# Patient Record
Sex: Male | Born: 1970 | Race: Black or African American | Hispanic: No | Marital: Married | Smoking: Never smoker
Health system: Southern US, Community
[De-identification: ages and names within clinical notes are randomized; demographics above are authoritative.]

## PROBLEM LIST (undated history)

## (undated) ENCOUNTER — Emergency Department (HOSPITAL_COMMUNITY): Admission: EM | Payer: No Typology Code available for payment source | Source: Home / Self Care

## (undated) DIAGNOSIS — N200 Calculus of kidney: Secondary | ICD-10-CM

## (undated) DIAGNOSIS — N281 Cyst of kidney, acquired: Secondary | ICD-10-CM

## (undated) DIAGNOSIS — M519 Unspecified thoracic, thoracolumbar and lumbosacral intervertebral disc disorder: Secondary | ICD-10-CM

## (undated) DIAGNOSIS — K760 Fatty (change of) liver, not elsewhere classified: Secondary | ICD-10-CM

## (undated) HISTORY — DX: Fatty (change of) liver, not elsewhere classified: K76.0

## (undated) HISTORY — DX: Cyst of kidney, acquired: N28.1

## (undated) HISTORY — PX: APPENDECTOMY: SHX54

---

## 2008-01-22 ENCOUNTER — Inpatient Hospital Stay (HOSPITAL_COMMUNITY): Admission: EM | Admit: 2008-01-22 | Discharge: 2008-01-30 | Payer: Self-pay | Admitting: Emergency Medicine

## 2010-06-06 ENCOUNTER — Emergency Department (HOSPITAL_COMMUNITY)
Admission: EM | Admit: 2010-06-06 | Discharge: 2010-06-06 | Disposition: A | Discharge status: Home / Self Care | Payer: No Typology Code available for payment source | Attending: Emergency Medicine | Admitting: Emergency Medicine

## 2010-06-06 DIAGNOSIS — M79609 Pain in unspecified limb: Secondary | ICD-10-CM | POA: Insufficient documentation

## 2010-06-06 DIAGNOSIS — Y929 Unspecified place or not applicable: Secondary | ICD-10-CM | POA: Insufficient documentation

## 2010-06-06 DIAGNOSIS — S8010XA Contusion of unspecified lower leg, initial encounter: Secondary | ICD-10-CM | POA: Insufficient documentation

## 2010-08-13 NOTE — Op Note (Signed)
NAMEKATHLEEN, LIKINS                 ACCOUNT NO.:  [id number]   MEDICAL RECORD NO.:  [id number]          PATIENT TYPE:  INP   LOCATION:  5126                         FACILITY:  [facility]   PHYSICIAN:  Maisie Fus A. Cornett, M.D.DATE OF BIRTH:  [birth year]   DATE OF PROCEDURE:  01/22/2008  DATE OF DISCHARGE:                               OPERATIVE REPORT   PREOPERATIVE DIAGNOSIS:  History of open appendectomy 4 days ago with  right lower quadrant abdominal wound dehiscence.   POSTOPERATIVE DIAGNOSIS:  History of open appendectomy 4 days ago with  right lower quadrant abdominal wound dehiscence.   PROCEDURE:  Closure of right lower quadrant abdominal wound.   SURGEON:  Maisie Fus A. Cornett, MD   ANESTHESIA:  General endotracheal anesthesia.   ESTIMATED BLOOD LOSS:  30 mL.   DRAINS:  None.   INDICATIONS FOR PROCEDURE:  The patient is a pleasant 40 year old male  who is in [place] about 4 ago on business.  He developed appendicitis  and underwent an open appendectomy in [place].  He was brought was home  by his wife, but overnight apparently had some coughing and felt a pop  in his abdominal wall and then begin to have nausea and vomiting.  Came  to the emergency room.  CT scan showed dehiscence of the abdominal  musculature in his right lower quadrant appendectomy incision.  There  was small bowel up in this.  He was brought emergently to the operating  room for closure of the abdominal wall dehiscence.   DESCRIPTION OF PROCEDURE:  The patient was brought to the operating room  and placed supine.  Induction of general anesthesia, the abdomen was  prepped and draped in sterile fashion.  Right lower quadrant incision  was then opened.  There was no signs of infection.  Upon opening the  wound, I was able to get down to the subcutaneous fat till I encountered  the aponeurosis of the external oblique.  There were silk sutures  closing this in an interrupted fashion and I went ahead and opened  this  up.  Once I did this, I was able to visualize the internal oblique and  transversus muscle layers.  The medial portion of this part of the  abdominal wall had opened.  There were small bowel stuck in this.  I was  easily able to get my finger pushed this back in and then I opened this  layer up by removing the deep layers interrupted sutures and it.  Once I  did this, I was able then to put my finger in the wound sweep around.  The bowel was healthy and viable and was replaced back in the abdominal  cavity.  There were some mild edema.  I then was able to locate the  peritoneum and grasped this with Kochers.  I then close this layer.  After irrigating the wound out with a running 2-0 PDS suture, the tissue  was healthy with no signs of necrosis nor any signs of infection.  I was  able to close this deep layer with two of these to  encompass the  peritoneal lining as well as the internal as well as the transversus  muscle.  Another layer of 2-0 PDS was used to close the external oblique  overall this and two of these were used in a running fashion.  I then  closed the external oblique with a 2-0 PDS  running suture.  I irrigated the subcu tissues.  I elected to close the  skin with staples put packing in between each these skin staples.  Dry  dressing was applied.  All final counts of sponge, needle and  instruments were found to be correct this portion of the case.  The  patient was then awoke, taken to recovery in satisfactory condition.      Thomas A. Cornett, M.D.  Electronically Signed     TAC/MEDQ  D:  01/22/2008  T:  01/22/2008  Job:  [id number]

## 2010-08-13 NOTE — H&P (Signed)
Jonathan Mcmahon, Jonathan Mcmahon                 ACCOUNT NO.:  [id number]   MEDICAL RECORD NO.:  [id number]          PATIENT TYPE:  INP   LOCATION:                               FACILITY:  [facility]   PHYSICIAN:  Wilmon Arms. Corliss Skains, M.D. DATE OF BIRTH:  04/24/1970   DATE OF ADMISSION:  01/22/2008  DATE OF DISCHARGE:                              HISTORY & PHYSICAL   CHIEF COMPLAINT:  Abdominal distention, nausea, and vomiting.   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  He is status post an open appendectomy  on January 19, 2008 in [place], [place].  He apparently was discharged  from that hospital on January 20, 2008.  The patient reports that he had  a lot of coughing postoperatively.  His wife drove him back from [place]  and ever since discharge, he has had a lot of abdominal distention,  nausea, and vomiting.  He has not had a bowel movement since discharge.  He presents to the emergency department for evaluation.   PAST MEDICAL HISTORY:  Kidney stones.   PAST SURGICAL HISTORY:  Open appendectomy.   ALLERGIES:  None.   MEDICATIONS:  None.   SOCIAL HISTORY:  Nonsmoker, nondrinker.   PHYSICAL EXAMINATION:  VITAL SIGNS:  Temperature 99.0, blood pressure  143/94, pulse 106, respirations 20, sat is 94%.  GENERAL:  This is a well-developed, well-nourished male who appears  uncomfortable.  HEENT:  EOMI.  Sclerae anicteric.  NECK:  No mass.  No thyromegaly.  LUNGS:  Clear.  Normal respiratory effort.  HEART:  Regular rate and rhythm.  No murmur.  ABDOMEN:  Positive bowel sounds, distended.  His incision is intact.  He  is tender around this incision.   LABORATORY STUDIES:  CT scan of the abdomen and pelvis shows dilated  proximal small bowel with obstruction in the right lower quadrant  abdominal wall defect.  No sign of free air.  No fluid collection or  abscess.  White count 11.7, hemoglobin 15.1, lactate 1.6, lipase 13.  Electrolytes within normal limits.   IMPRESSION:  Fascial dehiscence in the right lower quadrant from recent  open appendectomy with resulted small-bowel obstruction.   PLAN:  He will go to the operating room today for exploratory laparotomy  and repair of abdominal wall defect.  I have discussed this case with  Dr. Luisa Hart, who is on call today.  He will resume care of this patient.      Wilmon Arms. Tsuei, M.D.  Electronically Signed     MKT/MEDQ  D:  01/22/2008  T:  01/22/2008  Job:  [id number]

## 2010-08-16 NOTE — Discharge Summary (Signed)
NAMECEPHAS, REVARD                 ACCOUNT NO.:  [id number]   MEDICAL RECORD NO.:  [id number]          PATIENT TYPE:  INP   LOCATION:  5126                         FACILITY:  [facility]   PHYSICIAN:  Gabrielle Dare. Janee Morn, M.D.DATE OF BIRTH:  11-11-1970   DATE OF ADMISSION:  01/21/2008  DATE OF DISCHARGE:  01/30/2008                               DISCHARGE SUMMARY   ADMITTING PHYSICIAN:  Wilmon Arms. Corliss Skains, MD   DISCHARGING PHYSICIAN:  Gabrielle Dare. Janee Morn, MD   PROCEDURES:  Closure of right lower quadrant abdominal wound dehiscence  by Dr. Luisa Hart on January 22, 2008.  There were no consultants.   REASON FOR ADMISSION:  Mr. 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  He was status post an open  appendectomy on December 30, 2007 in [place], [place].  He apparently was  discharged from the hospital on January 20, 2008.  The patient reports  that he had a lot of coughing postoperatively.  He states his wife drove  him back from [place] and ever since the discharge, he has had lot of  abdominal distention, nausea, and vomiting.  He also states that he has  not had a bowel movement since discharge.  This time, he was presented  to the emergency department for evaluation.  CT scan of the abdomen and  pelvis shows dilated proximal small bowel with obstruction of the right  lower quadrant and an abdominal wall defect.  Please see admitting  history and physical for further details.   ADMITTING DIAGNOSIS:  Fascial dehiscence in the right lower quadrant  from recent open appendectomy with results of small bowel obstruction.   HOSPITAL COURSE:  At this time, the patient was admitted and taken to  the operating room for an exploratory laparotomy with repair of his  abdominal wall defect.  He was also started on cefoxitin prior to the OR  as well.  Was in the OR.  A closure of his right lower quadrant  abdominal wound dehiscence was performed by Dr. Luisa Hart.  The patient  tolerated this procedure well without any complications.  On  postoperative day #1, the patient had an NG tube placed as the patient  became distended overnight with nausea.  On exam, his abdomen was  distended and tight with some bowel sounds.  However, at this time due  to his distention and nausea, his NG tube was continued in.  On  postoperative day #2, the patient was feeling better with pain better  controlled.  He states he is passing flatus.  However, on exam, he is to  feel tight and distended, currently without any bowel sounds.  At this  time, the patient wants to try with his NG tube clamped and therefore,  his NG tube was clamped; however, he was kept n.p.o. except for few ice  chips.  However, by postoperative day #3, apparently overnight, the  patient got nauseated and his NG tube was put back to suction.  However,  on postoperative day #3, the patient was continuing to have no flatus,  but  felt pressure as if he was getting flatus.  At this time, his  abdomen was soft and still tender with some distention.  At this time,  his NG tube was once again clamped and he was given a Dulcolax  suppository.  By postoperative day #4, the patient was not complaining  of any nausea or vomiting with his NG tube clamped.  He states that he  had had one bowel movement the day prior with his suppository, however,  he denied any further flatus at this time.  At this time due to the  patient not having any nausea and vomiting, his NG tube was  discontinued.  However, he was kept n.p.o. except for ice chips.  By  postoperative day #5, it was felt that the patient's postop ileus had  began to resolve as the patient was currently passing flatus and had few  bowel sounds.  Therefore at this time, his diet was advanced to clear  liquids.  At this time, his diet was continued over the next several  days and by postoperative day #8, the patient's diet had been completely  advanced and was tolerating  this well.  Also with the advancement of his  diet, his IV pain medicines were discontinued and he was started on p.o.  Vicodin, which the patient was tolerating well.  By postoperative day,  the patient's wound was clean, dry, and intact.  His abdomen was soft  with active bowel sounds.  At this time, it was felt that the patient  was stable for discharge home.   DISCHARGE DIAGNOSES:  1. Status post open appendectomy in [place].  2. Right lower quadrant abdominal wound dehiscence.  3. Status post exploratory laparotomy with repair of abdominal wall      dehiscence.  4. Postoperative ileus which had resolved.   DISCHARGE MEDICATIONS:  He had no home medications and he was sent home  with Vicodin 5/500 one to two q.6 h. as needed for pain.   DISCHARGE INSTRUCTIONS:  The patient had no dietary restrictions.  He  was informed that he may increase his activity slowly and he may walk  with steps.  He was informed that he may shower, however, he was not  bathe for the next 2 weeks.  Otherwise, he is not to lift anything for  approximately the next 5 weeks.  He was also given the instructions that  he was not allowed to drive until his followup appointment, which was  set to see Dr. Corliss Skains in 1 week.  Of note, Dr. Luisa Hart did his surgery,  so, I am not sure whether he actually followed up with Dr. Corliss Skains or Dr.  Luisa Hart.  Either away, he is to see one of whom in 1 week after his  discharge.      Letha Cape, PA      Gabrielle Dare. Janee Morn, M.D.  Electronically Signed    KEO/MEDQ  D:  03/02/2008  T:  03/02/2008  Job:  [id number]   cc:   Thomas A. Cornett, M.D.  Wilmon Arms. Tsuei, M.D.

## 2010-12-31 LAB — CBC
HCT: 37.5 — ABNORMAL LOW
HCT: 38.8 — ABNORMAL LOW
HCT: 39.1
HCT: 44.7
Hemoglobin: 12.5 — ABNORMAL LOW
Hemoglobin: 13
MCHC: 32.5
MCHC: 33.2
MCHC: 33.5
MCHC: 33.8
MCV: 90.2
MCV: 93.3
Platelets: 234
Platelets: 264
RBC: 4.05 — ABNORMAL LOW
RBC: 4.19 — ABNORMAL LOW
RBC: 4.95
RDW: 13.1
RDW: 13.3
RDW: 13.5
WBC: 11.1 — ABNORMAL HIGH
WBC: 11.2 — ABNORMAL HIGH
WBC: 11.4 — ABNORMAL HIGH
WBC: 11.7 — ABNORMAL HIGH

## 2010-12-31 LAB — BASIC METABOLIC PANEL
BUN: 10
BUN: 8
BUN: 8
Calcium: 8.7
Chloride: 109
Creatinine, Ser: 0.61
Creatinine, Ser: 0.64
Creatinine, Ser: 0.64
GFR calc Af Amer: >60
GFR calc Af Amer: >60
GFR calc Af Amer: >60
GFR calc Af Amer: >60
GFR calc non Af Amer: >60
Glucose, Bld: 100 — ABNORMAL HIGH
Glucose, Bld: 104 — ABNORMAL HIGH
Glucose, Bld: 111 — ABNORMAL HIGH
Glucose, Bld: 96
Potassium: 3.4 — ABNORMAL LOW
Potassium: 3.7
Sodium: 139
Sodium: 141

## 2010-12-31 LAB — COMPREHENSIVE METABOLIC PANEL
ALT: 36
Albumin: 3.2 — ABNORMAL LOW
Alkaline Phosphatase: 51
BUN: 15
BUN: 9
Calcium: 8.5
Chloride: 98
Creatinine, Ser: 0.87
GFR calc Af Amer: >60
GFR calc Af Amer: >60
Glucose, Bld: 136 — ABNORMAL HIGH
Potassium: 3.9
Sodium: 139
Total Bilirubin: 0.9

## 2010-12-31 LAB — URINE CULTURE: Culture: NO GROWTH

## 2010-12-31 LAB — DIFFERENTIAL
Basophils Relative: 0
Eosinophils Relative: 0
Lymphs Abs: 1
Monocytes Absolute: 1.3 — ABNORMAL HIGH
Neutro Abs: 9.5 — ABNORMAL HIGH
Neutrophils Relative %: 81 — ABNORMAL HIGH

## 2010-12-31 LAB — URINE MICROSCOPIC-ADD ON

## 2010-12-31 LAB — URINALYSIS, ROUTINE W REFLEX MICROSCOPIC: Ketones, ur: >80 — AB

## 2011-03-15 ENCOUNTER — Encounter: Payer: Self-pay | Admitting: Emergency Medicine

## 2011-03-15 ENCOUNTER — Emergency Department (HOSPITAL_COMMUNITY): Payer: Self-pay

## 2011-03-15 ENCOUNTER — Emergency Department (HOSPITAL_COMMUNITY)
Admission: EM | Admit: 2011-03-15 | Discharge: 2011-03-15 | Disposition: A | Discharge status: Home / Self Care | Payer: Self-pay | Attending: Emergency Medicine | Admitting: Emergency Medicine

## 2011-03-15 DIAGNOSIS — R059 Cough, unspecified: Secondary | ICD-10-CM | POA: Insufficient documentation

## 2011-03-15 DIAGNOSIS — J069 Acute upper respiratory infection, unspecified: Secondary | ICD-10-CM | POA: Insufficient documentation

## 2011-03-15 DIAGNOSIS — J3489 Other specified disorders of nose and nasal sinuses: Secondary | ICD-10-CM | POA: Insufficient documentation

## 2011-03-15 DIAGNOSIS — M546 Pain in thoracic spine: Secondary | ICD-10-CM | POA: Insufficient documentation

## 2011-03-15 DIAGNOSIS — R6883 Chills (without fever): Secondary | ICD-10-CM | POA: Insufficient documentation

## 2011-03-15 DIAGNOSIS — R05 Cough: Secondary | ICD-10-CM | POA: Insufficient documentation

## 2011-03-15 DIAGNOSIS — R0789 Other chest pain: Secondary | ICD-10-CM | POA: Insufficient documentation

## 2011-03-15 MED ORDER — IBUPROFEN 800 MG PO TABS: 800.0000 mg | ORAL_TABLET | Freq: Three times a day (TID) | ORAL | Status: DC

## 2011-03-15 MED ORDER — ALBUTEROL SULFATE HFA 108 (90 BASE) MCG/ACT IN AERS
2.0000 | INHALATION_SPRAY | Freq: Four times a day (QID) | RESPIRATORY_TRACT | Status: DC
  Administered 2011-03-15: 2 via RESPIRATORY_TRACT
  Filled 2011-03-15: qty 6.7

## 2011-03-15 NOTE — ED Notes (Signed)
Patient presents with c/o cough for the last several days.  States his lower back is also hurting and his right abd feels numb.  States sputum is green, had chills several days ago also

## 2011-03-15 NOTE — ED Notes (Signed)
T. REPORTS PRODUCTIVE COUGH /I RIGHT LATERAL ABDOMINAL PAIN ONSET LAST Thursday WITH SLIGHT CHILLS AND LOW GRADE FEVER.

## 2011-03-15 NOTE — ED Provider Notes (Signed)
History     CSN: [id number] Arrival date & time: 03/15/2011  5:40 AM   First MD Initiated Contact with Patient 03/15/11 [phone]      Chief Complaint  Patient presents with  . Cough    (Consider location/radiation/quality/duration/timing/severity/associated sxs/prior treatment) HPI History provided by pt.   Pt has had a cough x 10 days.  Associated w/ chills, nasal congestion and pain in right upper back and right lower anterior chest.  No known fever and denies rhinorrhea, sore throat, body aches, weakness, SOB, N/V/D, abd pain, urinary sx.  No risk factors for PE.  Pt does not smoke cigarettes.   History reviewed. No pertinent past medical history.  Past Surgical History  Procedure Date  . Appendectomy     No family history on file.  History  Substance Use Topics  . Smoking status: Never Smoker   . Smokeless tobacco: Not on file  . Alcohol Use: Yes      Review of Systems  All other systems reviewed and are negative.    Allergies  Review of patient's allergies indicates no known allergies.  Home Medications  No current outpatient prescriptions on file.  BP 132/82  Pulse 75  Temp(Src) 97.8 F (36.6 C) (Oral)  Resp 16  SpO2 98%  Physical Exam  Nursing note and vitals reviewed. Constitutional: He is oriented to person, place, and time. He appears well-developed and well-nourished. No distress.  HENT:  Head: Normocephalic and atraumatic.  Eyes:       Normal appearance  Neck: Normal range of motion.  Cardiovascular: Normal rate and regular rhythm.   Pulmonary/Chest: Effort normal and breath sounds normal. No respiratory distress. He exhibits no tenderness.       Coughing  Abdominal: Soft. He exhibits no distension. There is no tenderness.  Musculoskeletal: He exhibits no edema and no tenderness.  Neurological: He is alert and oriented to person, place, and time.  Skin: Skin is warm and dry. No rash noted.  Psychiatric: He has a normal mood and affect. His  behavior is normal.    ED Course  Procedures (including critical care time)  Labs Reviewed - No data to display Dg Chest 2 View  03/15/2011  *RADIOLOGY REPORT*  Clinical Data: Cough, congestion, chills and right upper quadrant pain.  CHEST - 2 VIEW  Comparison: Plain films of the chest 01/22/2008.  Findings: Lung volumes are low but the lungs are clear.  Heart size is normal.  No pneumothorax or pleural effusion.  No focal bony abnormality.  IMPRESSION: No acute disease.  Original Report Authenticated By: Bernadene Bell. D'ALESSIO, M.D.     1. Viral upper respiratory infection       MDM  Healthy 40yo M presents w/ cough x 10 days w/ associated right anterior chest and right upper back pain.  Afebrile and VS w/in nml limits on exam.  No respiratory distress and lungs clear.  Coughing.  CXR pending to r/o pneumonia d/t pain and duration of sx.  Pt has received an albuterol inhaler.   CXR neg.  Will treat pt conservatively for bronchitis/viral URI.  Return precautions, including worsening chest pain, SOB and persistent sx discussed.       Otilio Miu, [place] 03/15/11 [phone]

## 2011-03-15 NOTE — ED Provider Notes (Signed)
Medical screening examination/treatment/procedure(s) were performed by non-physician practitioner and as supervising physician I was immediately available for consultation/collaboration.   Hanley Seamen, MD 03/15/11 1020

## 2011-03-20 ENCOUNTER — Emergency Department (HOSPITAL_COMMUNITY)
Admission: EM | Admit: 2011-03-20 | Discharge: 2011-03-20 | Disposition: A | Discharge status: Home / Self Care | Payer: Self-pay | Attending: Emergency Medicine | Admitting: Emergency Medicine

## 2011-03-20 ENCOUNTER — Encounter (HOSPITAL_COMMUNITY): Payer: Self-pay | Admitting: Emergency Medicine

## 2011-03-20 DIAGNOSIS — R209 Unspecified disturbances of skin sensation: Secondary | ICD-10-CM | POA: Insufficient documentation

## 2011-03-20 DIAGNOSIS — R059 Cough, unspecified: Secondary | ICD-10-CM | POA: Insufficient documentation

## 2011-03-20 DIAGNOSIS — R202 Paresthesia of skin: Secondary | ICD-10-CM

## 2011-03-20 DIAGNOSIS — J40 Bronchitis, not specified as acute or chronic: Secondary | ICD-10-CM | POA: Insufficient documentation

## 2011-03-20 DIAGNOSIS — R05 Cough: Secondary | ICD-10-CM | POA: Insufficient documentation

## 2011-03-20 HISTORY — DX: Unspecified thoracic, thoracolumbar and lumbosacral intervertebral disc disorder: M51.9

## 2011-03-20 MED ORDER — PREDNISONE 20 MG PO TABS: 40.0000 mg | ORAL_TABLET | Freq: Every day | ORAL | Status: DC

## 2011-03-20 MED ORDER — AZITHROMYCIN 250 MG PO TABS: 250.0000 mg | ORAL_TABLET | Freq: Every day | ORAL | Status: AC

## 2011-03-20 NOTE — ED Provider Notes (Signed)
Medical screening examination/treatment/procedure(s) were performed by non-physician practitioner and as supervising physician I was immediately available for consultation/collaboration.    Nelia Shi, MD 03/20/11 2219

## 2011-03-20 NOTE — ED Provider Notes (Signed)
History     CSN: [id number]  Arrival date & time 03/20/11  1109   First MD Initiated Contact with Patient 03/20/11 1157      Chief Complaint  Patient presents with  . Cough    persistant dry cough causing right side numbness    (Consider location/radiation/quality/duration/timing/severity/associated sxs/prior treatment) Patient is a 40 y.o. male presenting with cough. The history is provided by the patient.  Cough This is a new problem. The current episode started more than 1 week ago. The problem occurs constantly. The problem has not changed since onset.The cough is productive of sputum. There has been no fever. Pertinent negatives include no chest pain, no chills, no sweats, no ear congestion, no ear pain, no headaches, no rhinorrhea, no sore throat, no myalgias, no shortness of breath and no wheezing. He has tried decongestants and cough syrup for the symptoms.  Pt states he was seen 5 days ago, had negative CXR. States cough continues. States now has numbness sensation over right abdomen, right flank, right thigh. Pt denies back or neck pain. Denies abdominal pain. Denies SOB. Deies fever.   Past Medical History  Diagnosis Date  . 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 Appendectomy     No family history on file.  History  Substance Use Topics  . Smoking status: Never Smoker   . Smokeless tobacco: Not on file  . Alcohol Use: Yes      Review of Systems  Constitutional: Negative for fever and chills.  HENT: Negative for ear pain, congestion, sore throat and rhinorrhea.   Eyes: Negative.   Respiratory: Positive for cough. Negative for shortness of breath and wheezing.   Cardiovascular: Negative for chest pain.  Gastrointestinal: Negative.   Genitourinary: Negative.   Musculoskeletal: Negative for myalgias.  Neurological: Positive for numbness. Negative for weakness and headaches.    Allergies  Review of  patient's allergies indicates no known allergies.  Home Medications   Current Outpatient Rx  Name Route Sig Dispense Refill  . ALBUTEROL SULFATE HFA 108 (90 BASE) MCG/ACT IN AERS Inhalation Inhale 2 puffs into the lungs every 6 (six) hours as needed. For cough     . ADULT MULTIVITAMIN W/MINERALS CH Oral Take 1 tablet by mouth daily.      Marland Kitchen PHENYLEPHRINE-DM-GG 2.5-5-100 MG/5ML PO LIQD Oral Take 15 mLs by mouth as needed. For cough symptoms    . DAYQUIL MULTI-SYMPTOM PO Oral Take 1-2 tablets by mouth every 6 (six) hours as needed. Cold symptoms       BP 126/77  Pulse 63  Temp(Src) 97.8 F (36.6 C) (Oral)  Resp 18  SpO2 99%  Physical Exam  Nursing note and vitals reviewed. Constitutional: He is oriented to person, place, and time. He appears well-developed and well-nourished.  HENT:  Head: Normocephalic and atraumatic.  Neck: Neck supple.  Cardiovascular: Normal rate, regular rhythm and normal heart sounds.   Pulmonary/Chest: Effort normal and breath sounds normal. No respiratory distress. He has no wheezes. He has no rales.  Abdominal: Soft. Bowel sounds are normal. There is no tenderness.  Musculoskeletal:       No tenderness over entire spine, no swelling or step offs  Neurological: He is alert and oriented to person, place, and time.       Pt able to feel, but decreased sensation over right flank, right lower abdomen, right anterior thigh. Normal strength of LE.  Normal 2+ patellar reflexes bilaterally.  Pt able to dorsiflex bilateral toes and feet  Skin: Skin is warm and dry.  Psychiatric: He has a normal mood and affect.    ED Course  Procedures (including critical care time)   Dg Chest 2 View  03/15/2011  *RADIOLOGY REPORT*  Clinical Data: Cough, congestion, chills and right upper quadrant pain.  CHEST - 2 VIEW  Comparison: Plain films of the chest 01/22/2008.  Findings: Lung volumes are low but the lungs are clear.  Heart size is normal.  No pneumothorax or pleural  effusion.  No focal bony abnormality.  IMPRESSION: No acute disease.  Original Report Authenticated By: Bernadene Bell. D'ALESSIO, M.D.   X-ray negative on 03/15/11. Suspect bronchitis, will treat since cough been going on for almost 3 weeks now, will try steroids as well. The paresthesias suspect are related to possible disk impingement. normal strength of LE, normal gait, abdomen non tender. No loss of bowels, no urinary retention. No fever.  Will d/c home with follow up.     MDM          Lottie Mussel, PA 03/20/11 1554

## 2014-04-30 ENCOUNTER — Encounter (HOSPITAL_COMMUNITY): Payer: Self-pay | Admitting: Emergency Medicine

## 2014-04-30 ENCOUNTER — Emergency Department (HOSPITAL_COMMUNITY): Payer: Self-pay

## 2014-04-30 ENCOUNTER — Emergency Department (HOSPITAL_COMMUNITY)
Admission: EM | Admit: 2014-04-30 | Discharge: 2014-04-30 | Disposition: A | Discharge status: Home / Self Care | Payer: Self-pay | Attending: Emergency Medicine | Admitting: Emergency Medicine

## 2014-04-30 DIAGNOSIS — Z8739 Personal history of other diseases of the musculoskeletal system and connective tissue: Secondary | ICD-10-CM | POA: Insufficient documentation

## 2014-04-30 DIAGNOSIS — R05 Cough: Secondary | ICD-10-CM | POA: Insufficient documentation

## 2014-04-30 DIAGNOSIS — R0602 Shortness of breath: Secondary | ICD-10-CM | POA: Insufficient documentation

## 2014-04-30 DIAGNOSIS — R0789 Other chest pain: Secondary | ICD-10-CM | POA: Insufficient documentation

## 2014-04-30 DIAGNOSIS — R079 Chest pain, unspecified: Secondary | ICD-10-CM

## 2014-04-30 LAB — I-STAT TROPONIN, ED
TROPONIN I, POC: 0 ng/mL (ref 0.00–0.08)
Troponin i, poc: 0 ng/mL (ref 0.00–0.08)

## 2014-04-30 LAB — BASIC METABOLIC PANEL
ANION GAP: 9 (ref 5–15)
BUN: 14 mg/dL (ref 6–23)
CALCIUM: 9.3 mg/dL (ref 8.4–10.5)
CO2: 25 mmol/L (ref 19–32)
CREATININE: 0.76 mg/dL (ref 0.50–1.35)
Chloride: 107 mmol/L (ref 96–112)
GFR calc Af Amer: >90 mL/min (ref >90)
Glucose, Bld: 91 mg/dL (ref 70–99)
POTASSIUM: 4.1 mmol/L (ref 3.5–5.1)
Sodium: 141 mmol/L (ref 135–145)

## 2014-04-30 LAB — CBC
HCT: 42.4 % (ref 39.0–52.0)
HEMOGLOBIN: 13.9 g/dL (ref 13.0–17.0)
MCH: 30.1 pg (ref 26.0–34.0)
MCHC: 32.8 g/dL (ref 30.0–36.0)
MCV: 91.8 fL (ref 78.0–100.0)
Platelets: 260 10*3/uL (ref 150–400)
RBC: 4.62 MIL/uL (ref 4.22–5.81)
RDW: 12.8 % (ref 11.5–15.5)
WBC: 7.8 10*3/uL (ref 4.0–10.5)

## 2014-04-30 LAB — PROTIME-INR
INR: 1 (ref 0.00–1.49)
Prothrombin Time: 13.3 seconds (ref 11.6–15.2)

## 2014-04-30 LAB — BRAIN NATRIURETIC PEPTIDE: B NATRIURETIC PEPTIDE 5: 20.4 pg/mL (ref 0.0–100.0)

## 2014-04-30 MED ORDER — ASPIRIN 81 MG PO CHEW: 324.0000 mg | CHEWABLE_TABLET | Freq: Every day | ORAL | Status: DC

## 2014-04-30 MED ORDER — ASPIRIN 81 MG PO CHEW
324.0000 mg | CHEWABLE_TABLET | Freq: Once | ORAL | Status: AC
  Administered 2014-04-30: 324 mg via ORAL
  Filled 2014-04-30: qty 4

## 2014-04-30 MED ORDER — BENZONATATE 100 MG PO CAPS: 100.0000 mg | ORAL_CAPSULE | Freq: Three times a day (TID) | ORAL | Status: DC

## 2014-04-30 MED ORDER — SODIUM CHLORIDE 0.9 % IV SOLN: 1000.0000 mL | INTRAVENOUS | Status: DC

## 2014-04-30 NOTE — ED Notes (Signed)
Pt reports L side cp.  Pt reports initially started with palpitations x 2 weeks ago while driving.  He states it lasted for 2 hours with SOB and dizziness.  Pt reports he was not evaluated then.  Pt reports pain in his chest is non-radiating.  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  Denies noticing any swelling or pain in his LE.

## 2014-04-30 NOTE — ED Notes (Signed)
Pt c/o mild chest pain that started last night, pt states he had an episode this morning where heart was racing. Pt states when heart was racing he and SOB none now, denies n/v.

## 2014-04-30 NOTE — ED Provider Notes (Signed)
CSN: [id number]     Arrival date & time 04/30/14  1519 History   First MD Initiated Contact with Patient 04/30/14 1614     Chief Complaint  Patient presents with  . Chest Pain   HPI Comments: Pt noticed he was having some pain on his chest when he pushed on it or when he bent down and moved.  Today he noticed his heart racing and felt short of breath.    He did have a similar episode a couple of weeks ago.  Now he just has a mild stinging in the chest area.  At times that intensifies for a few seconds before it goes away on its own.  Patient is a 44 y.o. male presenting with chest pain. The history is provided by the patient.  Chest Pain Pain location:  Substernal area Pain quality: sharp   Onset quality:  Gradual Duration:  1 day Chronicity:  New Relieved by:  Nothing Associated symptoms: cough and shortness of breath   Associated symptoms: no back pain, no fever, no nausea and not vomiting  Palpitations: when the heart was racing, none now.   Associated symptoms comment:  No leg swelling Risk factors: hypertension   Risk factors: no coronary artery disease, no prior DVT/PE and no smoking   Risk factors comment:  Father did have an MI in his 5250s, he also had DM   Past Medical History  Diagnosis Date  . Disc disorder     "MRI bulging disc L4 and L5"   Past Surgical History  Procedure Laterality Date  . Appendectomy     Family History  Problem Relation Age of Onset  . Renal cancer Mother   . CAD Father    History  Substance Use Topics  . Smoking status: Never Smoker   . Smokeless tobacco: Not on file  . Alcohol Use: Yes    Review of Systems  Constitutional: Negative for fever.  Respiratory: Positive for cough and shortness of breath.   Cardiovascular: Positive for chest pain. Palpitations: when the heart was racing, none now.  Gastrointestinal: Negative for nausea and vomiting.  Musculoskeletal: Negative for back pain.  All other systems reviewed and are  negative.     Allergies  Review of patient's allergies indicates no known allergies.  Home Medications   Prior to Admission medications   Medication Sig Start Date End Date Taking? Authorizing Provider  aspirin 81 MG chewable tablet Chew 4 tablets (324 mg total) by mouth daily. 04/30/14   Linwood DibblesJon Markus Casten, MD  Phenylephrine-DM-GG (MUCINEX FAST-MAX CONGEST COUGH) 2.5-5-100 MG/5ML LIQD Take 15 mLs by mouth as needed. For cough symptoms    Historical Provider, MD  predniSONE (DELTASONE) 20 MG tablet Take 2 tablets (40 mg total) by mouth daily. Patient not taking: Reported on 04/30/2014 03/20/11   Tatyana A Kirichenko, PA-C   BP 115/59 mmHg  Pulse 76  Temp(Src) 98.3 F (36.8 C) (Oral)  Resp 15  SpO2 96% Physical Exam  Constitutional: He appears well-developed and well-nourished. No distress.  HENT:  Head: Normocephalic and atraumatic.  Right Ear: External ear normal.  Left Ear: External ear normal.  Eyes: Conjunctivae are normal. Right eye exhibits no discharge. Left eye exhibits no discharge. No scleral icterus.  Neck: Neck supple. No tracheal deviation present.  Cardiovascular: Normal rate, regular rhythm and intact distal pulses.   Pulmonary/Chest: Effort normal and breath sounds normal. No stridor. No respiratory distress. He has no wheezes. He has no rales.  Abdominal: Soft. Bowel sounds  are normal. He exhibits no distension. There is no tenderness. There is no rebound and no guarding.  Musculoskeletal: He exhibits no edema or tenderness.  Neurological: He is alert. He has normal strength. No cranial nerve deficit (no facial droop, extraocular movements intact, no slurred speech) or sensory deficit. He exhibits normal muscle tone. He displays no seizure activity. Coordination normal.  Skin: Skin is warm and dry. No rash noted.  Psychiatric: He has a normal mood and affect.  Nursing note and vitals reviewed.   ED Course  Procedures (including critical care time) Labs Review Labs  Reviewed  CBC  BASIC METABOLIC PANEL  BRAIN NATRIURETIC PEPTIDE  PROTIME-INR  I-STAT TROPOININ, ED  I-STAT TROPOININ, ED  Rosezena Sensor, ED    Imaging Review Dg Chest 2 View  04/30/2014   CLINICAL DATA:  Chest pain and rapid heart rate today. No known cardiopulmonary problems. Nonsmoker.  EXAM: CHEST  2 VIEW  COMPARISON:  03/15/2011  FINDINGS: Normal heart, mediastinum and hila. Clear lungs. No pleural effusion or pneumothorax.  Bony thorax is intact.  IMPRESSION: No active cardiopulmonary disease.   Electronically Signed   By: Amie Portland M.D.   On: 04/30/2014 16:44     EKG Interpretation   Date/Time:  Sunday April 30 2014 15:33:32 EST Ventricular Rate:  82 PR Interval:  176 QRS Duration: 92 QT Interval:  356 QTC Calculation: 416 R Axis:   61 Text Interpretation:  Sinus rhythm Probable left atrial enlargement RSR'  in V1 or V2, probably normal variant ST elev, probable normal early repol  pattern Confirmed by ZAMMIT  MD, JOSEPH [phone]) on 04/30/2014 3:39:59 PM      MDM   Final diagnoses:  Chest pain, unspecified chest pain type    Pt has a heart score of 1.  Low risk for major cardiac events.  Considering his family history will have him follow up with cardiology.  Take an aspirin daily.   Return to ED for worsening symptoms    Linwood Dibbles, MD 04/30/14 2000

## 2014-04-30 NOTE — Discharge Instructions (Signed)

## 2014-07-24 ENCOUNTER — Telehealth: Payer: Self-pay | Admitting: Medical

## 2014-07-24 ENCOUNTER — Ambulatory Visit (INDEPENDENT_AMBULATORY_CARE_PROVIDER_SITE_OTHER): Payer: BLUE CROSS/BLUE SHIELD | Admitting: Medical

## 2014-07-24 ENCOUNTER — Encounter: Payer: Self-pay | Admitting: Medical

## 2014-07-24 VITALS — BP 112/80 | HR 78 | Resp 16 | Ht 65.0 in | Wt 199.0 lb

## 2014-07-24 DIAGNOSIS — M549 Dorsalgia, unspecified: Secondary | ICD-10-CM

## 2014-07-24 DIAGNOSIS — R10811 Right upper quadrant abdominal tenderness: Secondary | ICD-10-CM

## 2014-07-24 DIAGNOSIS — E669 Obesity, unspecified: Secondary | ICD-10-CM | POA: Diagnosis not present

## 2014-07-24 DIAGNOSIS — G8929 Other chronic pain: Secondary | ICD-10-CM

## 2014-07-24 NOTE — Progress Notes (Signed)
Subjective: Here as a new patient today.  Was just seeing urgent care prior.    Has had right sided pains in abdomen since 2012, has gradually gotten worse over time.  Gets back pain from time to time.  Recently having palpitations, heart racing at times, uncomfortable.  Sometimes related to coffee, sometimes not.   Mild pain in chest at times.  Went to the hospital a month ago for heart racing, was advised that his heart was fine.  Had EKG, labs, was told that things looked fine.  When all this started he was taking OTC cold medication that may have caused some of his symptoms.  Has chronic back pain in general in low back, and wasn't sure if some of these issues were related to bulging disc. Last back MRI 13 years ago showing bulging disc.   No prior back surgery.    Right side belly pain is just under rib cage.  Was told prior he had fatty liver disease.  Pain is worse after eating any kinds of food.  Has recently started eating healthier.  No other aggravating or relieving factors. No other complaint.   ROS as in subjective  Objective: BP 112/80 mmHg  Pulse 78  Resp 16  Ht 5\' 5"  (1.651 m)  Wt 199 lb (90.266 kg)  BMI 33.12 kg/m2   General appearance: alert, no distress, WD/WN, pleasant obese AA male Neck: supple, no lymphadenopathy, no thyromegaly, no masses Heart: RRR, normal S1, S2, no murmurs Lungs: CTA bilaterally, no wheezes, rhonchi, or rales Abdomen: +bs, RLQ surgical scar, soft, mild RUQ tenderness, non tender, non distended, no masses, no hepatomegaly, no splenomegaly Back: mild pain with ROM, but ROM 90% of normal.  No deformity Musculoskeletal: nontender, no swelling, no obvious deformity Extremities: no edema, no cyanosis, no clubbing Pulses: 2+ symmetric, upper and lower extremities, normal cap refill Neurological: alert, oriented x 3, CN2-12 intact, strength normal upper extremities and lower extremities, sensation normal throughout, DTRs 2+ throughout, no cerebellar  signs, gait normal Psychiatric: normal affect, behavior normal, pleasant     Assessment: Encounter Diagnoses  Name Primary?  . RUQ abdominal tenderness Yes  . Chronic back pain   . Obesity      Plan: Reviewed 03/2014 labs, EKG, CXR from the ED reports.   At this point gall bladder disease is likely.   Will set up for abdominal ultrasound.  discussed differential diagnoses as well.  Advised he work on diet changes, exercise daily, spent good deal of time discussed recommended diet, advised weight loss changes, daily stretching.  F/u pending ultrasound.

## 2014-07-24 NOTE — Telephone Encounter (Signed)
Set up for RUQ abdominal ultrasound.  He is a Naval architecttruck driver ,usually out of town M-F, but is here today and tomorrow.

## 2014-07-24 NOTE — Telephone Encounter (Signed)
Write a note saying he can return to work currently, but has other evaluation pending

## 2014-07-24 NOTE — Telephone Encounter (Signed)
Please call  Patient states his employer wants a note that he may return to work  Social research officer, [employer] to [employer]  [place] [phone]

## 2014-07-25 ENCOUNTER — Other Ambulatory Visit: Payer: Self-pay | Admitting: Family Medicine

## 2014-07-25 DIAGNOSIS — R10811 Right upper quadrant abdominal tenderness: Secondary | ICD-10-CM

## 2014-07-25 NOTE — Telephone Encounter (Signed)
[facility] and all the appointment details. Appointment was changed to Jul 31, 2014 @ 740 am.

## 2014-07-25 NOTE — Telephone Encounter (Signed)
Patient came into the office today and Tammy type up the note to give to the patient to give to his employer.

## 2014-07-25 NOTE — Telephone Encounter (Signed)
Patient has an appointment for his abdomen ultrasound for 07/26/14 @ 145 pm [facility] [address] [place], [place] [phone]

## 2014-07-26 ENCOUNTER — Other Ambulatory Visit: Payer: Self-pay

## 2014-07-31 ENCOUNTER — Ambulatory Visit
Admission: RE | Admit: 2014-07-31 | Discharge: 2014-07-31 | Disposition: A | Discharge status: Home / Self Care | Payer: BLUE CROSS/BLUE SHIELD | Source: Ambulatory Visit | Attending: Medical | Admitting: Medical

## 2014-07-31 DIAGNOSIS — R10811 Right upper quadrant abdominal tenderness: Secondary | ICD-10-CM

## 2014-08-01 ENCOUNTER — Other Ambulatory Visit: Payer: Self-pay | Admitting: Family Medicine

## 2014-08-01 DIAGNOSIS — R9389 Abnormal findings on diagnostic imaging of other specified body structures: Secondary | ICD-10-CM

## 2014-08-14 ENCOUNTER — Other Ambulatory Visit: Payer: Self-pay | Admitting: Medical

## 2014-08-15 ENCOUNTER — Ambulatory Visit
Admission: RE | Admit: 2014-08-15 | Discharge: 2014-08-15 | Disposition: A | Discharge status: Home / Self Care | Payer: BLUE CROSS/BLUE SHIELD | Source: Ambulatory Visit | Attending: Medical | Admitting: Medical

## 2014-08-15 DIAGNOSIS — R9389 Abnormal findings on diagnostic imaging of other specified body structures: Secondary | ICD-10-CM

## 2014-08-15 MED ORDER — GADOBENATE DIMEGLUMINE 529 MG/ML IV SOLN
19.0000 mL | Freq: Once | INTRAVENOUS | Status: AC | PRN
  Administered 2014-08-15: 19 mL via INTRAVENOUS

## 2014-08-21 ENCOUNTER — Telehealth: Payer: Self-pay | Admitting: Medical

## 2014-08-21 ENCOUNTER — Encounter: Payer: Self-pay | Admitting: Medical

## 2014-08-21 ENCOUNTER — Ambulatory Visit (INDEPENDENT_AMBULATORY_CARE_PROVIDER_SITE_OTHER): Payer: BLUE CROSS/BLUE SHIELD | Admitting: Medical

## 2014-08-21 VITALS — BP 110/70 | HR 78 | Resp 15 | Wt 198.0 lb

## 2014-08-21 DIAGNOSIS — R1011 Right upper quadrant pain: Secondary | ICD-10-CM | POA: Insufficient documentation

## 2014-08-21 DIAGNOSIS — G4710 Hypersomnia, unspecified: Secondary | ICD-10-CM

## 2014-08-21 DIAGNOSIS — R5383 Other fatigue: Secondary | ICD-10-CM

## 2014-08-21 DIAGNOSIS — R109 Unspecified abdominal pain: Secondary | ICD-10-CM | POA: Diagnosis not present

## 2014-08-21 DIAGNOSIS — N400 Enlarged prostate without lower urinary tract symptoms: Secondary | ICD-10-CM

## 2014-08-21 DIAGNOSIS — R002 Palpitations: Secondary | ICD-10-CM

## 2014-08-21 DIAGNOSIS — R0683 Snoring: Secondary | ICD-10-CM | POA: Diagnosis not present

## 2014-08-21 DIAGNOSIS — K921 Melena: Secondary | ICD-10-CM | POA: Diagnosis not present

## 2014-08-21 DIAGNOSIS — G478 Other sleep disorders: Secondary | ICD-10-CM | POA: Diagnosis not present

## 2014-08-21 DIAGNOSIS — R0681 Apnea, not elsewhere classified: Secondary | ICD-10-CM

## 2014-08-21 DIAGNOSIS — R400 Somnolence: Secondary | ICD-10-CM

## 2014-08-21 LAB — POCT URINALYSIS DIPSTICK
BILIRUBIN UA: NEGATIVE
Blood, UA: NEGATIVE
Glucose, UA: NEGATIVE
KETONES UA: NEGATIVE
Leukocytes, UA: NEGATIVE
NITRITE UA: NEGATIVE
Protein, UA: NEGATIVE
Spec Grav, UA: >=1.03
Urobilinogen, UA: NEGATIVE
pH, UA: 6

## 2014-08-21 LAB — TSH: TSH: 0.829 u[IU]/mL (ref 0.350–4.500)

## 2014-08-21 NOTE — Progress Notes (Signed)
Subjective: Here accompanied by wife for recheck .   Has had right sided pains in abdomen since 2012, has gradually gotten worse over time.  Gets back pain from time to time.  From last visit was having palpitations, heart racing at times, uncomfortable.  Went to the hospital in January for heart racing, was advised that his heart was fine.  Had EKG, labs, was told that things looked fine.   Has chronic back pain in general in low back, and wasn't sure if some of these issues were related to bulging disc. Last back MRI 13 years ago showing bulging disc.   No prior back surgery.  Right side belly pain is just under rib cage.  Was told prior he had fatty liver disease.  Pain is worse after eating any kinds of food.  Has recently started eating healthier.    Gets occasional bright red blood in stool he attributes to hemorrhoids  No mucous in stool.  No family hx/o colon cancer or GI cancer.    Wife does report that he snores loud.  He does endorse several symptoms of sleep apnea - fatigue, daytime somnolence, non restful sleep, witnessed apnea.    He does get up 2-3 times per night to urinate, no urine stream changes, no other BPH symptoms.  He does report some mild ED concerns.   ROS as in subjective  Objective: BP 110/70 mmHg  Pulse 78  Resp 15  Wt 198 lb (89.812 kg)   General appearance: alert, no distress, WD/WN, pleasant obese AA male Neck: supple, no lymphadenopathy, no thyromegaly, no masses Heart: RRR, normal S1, S2, no murmurs Lungs: CTA bilaterally, no wheezes, rhonchi, or rales Abdomen: +bs, RLQ surgical scar, soft, mild RUQ tenderness, non tender, non distended, no masses, no hepatomegaly, no splenomegaly Back: mild pain with ROM, but ROM 90% of normal.  No deformity Musculoskeletal: nontender, no swelling, no obvious deformity Extremities: no edema, no cyanosis, no clubbing Pulses: 2+ symmetric, upper and lower extremities, normal cap refill Neurological: alert, oriented x 3,  CN2-12 intact, strength normal upper extremities and lower extremities, sensation normal throughout, DTRs 2+ throughout, no cerebellar signs, gait normal Psychiatric: normal affect, behavior normal, pleasant  GU: normal circumcised male, no mass, no hernia, no lymphadenopathy Rectal: anus normal tone, prostate seems mildly enlarged, no distinct nodule, occult negative stool   Assessment: Encounter Diagnoses  Name Primary?  . Right sided abdominal pain Yes  . Blood in stool   . Enlarged prostate on rectal examination   . Palpitations   . Non-restorative sleep   . Witnessed apneic spells   . Snoring   . Other fatigue   . Daytime somnolence     Plan: Palpations, abdominal discomfort.  Reviewed 03/2014 labs, EKG, CXR from the ED reports.  discussed differential diagnoses as well.  Advised he work on diet changes, exercise daily, spent good deal of time discussed recommended diet, advised weight loss changes, daily stretching.  Ultrasound and MRI abdomen still not showing obvious causes of abdominal discomfort.  Referral to GI for abdominal discomfort and blood in stool  Referral for sleep study given palpations and symptoms of sleep apnea  Prostate enlarged with mild BPH symptoms and some ED symptoms.   PSA lab today.  ep worth sleep scale score of 17.  Neck circumference 16"

## 2014-08-21 NOTE — Telephone Encounter (Signed)
PATIENT IS AWARE OF HIS APPOINTMENT TO [facility] 08/[facility] @ 830 AM [phone] [address] [place], [place]

## 2014-08-21 NOTE — Telephone Encounter (Signed)
Referral to GI for blood in stool and chronic abdominal discomfort  Refer for sleep study, split night, home or sleep lab

## 2014-08-21 NOTE — Telephone Encounter (Signed)
I FAX OVER THE PATIENTS INFORMATION TO [facility]

## 2014-08-22 LAB — PSA: PSA: 0.8 ng/mL (ref <=4.00)

## 2014-09-01 ENCOUNTER — Other Ambulatory Visit: Payer: Self-pay | Admitting: Cardiology

## 2014-09-01 DIAGNOSIS — R079 Chest pain, unspecified: Secondary | ICD-10-CM

## 2014-09-11 ENCOUNTER — Encounter (HOSPITAL_COMMUNITY): Payer: BLUE CROSS/BLUE SHIELD

## 2014-09-18 ENCOUNTER — Ambulatory Visit (HOSPITAL_COMMUNITY): Payer: BLUE CROSS/BLUE SHIELD

## 2014-09-18 ENCOUNTER — Other Ambulatory Visit (HOSPITAL_COMMUNITY): Payer: BLUE CROSS/BLUE SHIELD

## 2014-10-23 ENCOUNTER — Ambulatory Visit: Payer: BLUE CROSS/BLUE SHIELD | Admitting: Medical

## 2014-10-27 ENCOUNTER — Encounter: Payer: Self-pay | Admitting: Medical

## 2015-03-30 ENCOUNTER — Ambulatory Visit: Payer: BLUE CROSS/BLUE SHIELD | Admitting: Medical

## 2015-04-03 ENCOUNTER — Encounter: Payer: Self-pay | Admitting: Medical

## 2015-04-03 ENCOUNTER — Encounter (HOSPITAL_COMMUNITY): Payer: Self-pay | Admitting: Emergency Medicine

## 2015-04-03 ENCOUNTER — Other Ambulatory Visit: Payer: Self-pay | Admitting: Medical

## 2015-04-03 ENCOUNTER — Telehealth: Payer: Self-pay

## 2015-04-03 ENCOUNTER — Ambulatory Visit (INDEPENDENT_AMBULATORY_CARE_PROVIDER_SITE_OTHER): Payer: BLUE CROSS/BLUE SHIELD | Admitting: Medical

## 2015-04-03 VITALS — BP 134/72 | HR 78 | Resp 14 | Wt 182.8 lb

## 2015-04-03 DIAGNOSIS — R251 Tremor, unspecified: Secondary | ICD-10-CM | POA: Diagnosis not present

## 2015-04-03 DIAGNOSIS — R631 Polydipsia: Secondary | ICD-10-CM | POA: Diagnosis not present

## 2015-04-03 DIAGNOSIS — R269 Unspecified abnormalities of gait and mobility: Secondary | ICD-10-CM | POA: Diagnosis not present

## 2015-04-03 DIAGNOSIS — G259 Extrapyramidal and movement disorder, unspecified: Secondary | ICD-10-CM | POA: Diagnosis not present

## 2015-04-03 DIAGNOSIS — R5383 Other fatigue: Secondary | ICD-10-CM | POA: Insufficient documentation

## 2015-04-03 DIAGNOSIS — R531 Weakness: Secondary | ICD-10-CM | POA: Diagnosis not present

## 2015-04-03 DIAGNOSIS — R634 Abnormal weight loss: Secondary | ICD-10-CM

## 2015-04-03 LAB — CBC WITH DIFFERENTIAL/PLATELET
Basophils Absolute: 0 10*3/uL (ref 0.0–0.1)
Basophils Relative: 0 % (ref 0–1)
EOS ABS: 0.1 10*3/uL (ref 0.0–0.7)
EOS PCT: 2 % (ref 0–5)
HEMATOCRIT: 40.6 % (ref 39.0–52.0)
Hemoglobin: 13.4 g/dL (ref 13.0–17.0)
Lymphocytes Relative: 30 % (ref 12–46)
Lymphs Abs: 1.7 10*3/uL (ref 0.7–4.0)
MCH: 28.5 pg (ref 26.0–34.0)
MCHC: 33 g/dL (ref 30.0–36.0)
MCV: 86.4 fL (ref 78.0–100.0)
MONO ABS: 0.7 10*3/uL (ref 0.1–1.0)
MPV: 9.7 fL (ref 8.6–12.4)
Monocytes Relative: 13 % — ABNORMAL HIGH (ref 3–12)
Neutro Abs: 3.1 10*3/uL (ref 1.7–7.7)
Neutrophils Relative %: 55 % (ref 43–77)
PLATELETS: 280 10*3/uL (ref 150–400)
RBC: 4.7 MIL/uL (ref 4.22–5.81)
RDW: 12.6 % (ref 11.5–15.5)
WBC: 5.7 10*3/uL (ref 4.0–10.5)

## 2015-04-03 LAB — POCT URINALYSIS DIPSTICK
Bilirubin, UA: NEGATIVE
Glucose, UA: NEGATIVE
Ketones, UA: NEGATIVE
Leukocytes, UA: NEGATIVE
Nitrite, UA: NEGATIVE
PH UA: 5.5
PROTEIN UA: NEGATIVE
RBC UA: NEGATIVE
UROBILINOGEN UA: NEGATIVE

## 2015-04-03 LAB — BASIC METABOLIC PANEL
Anion gap: 8 (ref 5–15)
BUN: 10 mg/dL (ref 6–20)
CALCIUM: 9.6 mg/dL (ref 8.9–10.3)
CO2: 26 mmol/L (ref 22–32)
CREATININE: 0.55 mg/dL — AB (ref 0.61–1.24)
Chloride: 106 mmol/L (ref 101–111)
GFR calc Af Amer: >60 mL/min (ref >60)
Glucose, Bld: 118 mg/dL — ABNORMAL HIGH (ref 65–99)
POTASSIUM: 3.8 mmol/L (ref 3.5–5.1)
SODIUM: 140 mmol/L (ref 135–145)

## 2015-04-03 LAB — COMPREHENSIVE METABOLIC PANEL
ALBUMIN: 4.1 g/dL (ref 3.6–5.1)
ALK PHOS: 60 U/L (ref 40–115)
ALT: 50 U/L — AB (ref 9–46)
AST: 21 U/L (ref 10–40)
BILIRUBIN TOTAL: 0.6 mg/dL (ref 0.2–1.2)
BUN: 13 mg/dL (ref 7–25)
CO2: 23 mmol/L (ref 20–31)
Calcium: 10 mg/dL (ref 8.6–10.3)
Chloride: 107 mmol/L (ref 98–110)
Creat: 0.46 mg/dL — ABNORMAL LOW (ref 0.60–1.35)
Glucose, Bld: 101 mg/dL — ABNORMAL HIGH (ref 65–99)
Potassium: 4.6 mmol/L (ref 3.5–5.3)
SODIUM: 140 mmol/L (ref 135–146)
TOTAL PROTEIN: 6.8 g/dL (ref 6.1–8.1)

## 2015-04-03 LAB — URINALYSIS, ROUTINE W REFLEX MICROSCOPIC
Bilirubin Urine: NEGATIVE
GLUCOSE, UA: NEGATIVE mg/dL
Hgb urine dipstick: NEGATIVE
KETONES UR: NEGATIVE mg/dL
Leukocytes, UA: NEGATIVE
Nitrite: NEGATIVE
PROTEIN: NEGATIVE mg/dL
Specific Gravity, Urine: 1.021 (ref 1.005–1.030)
pH: 5.5 (ref 5.0–8.0)

## 2015-04-03 LAB — CBC
HCT: 39.4 % (ref 39.0–52.0)
Hemoglobin: 12.9 g/dL — ABNORMAL LOW (ref 13.0–17.0)
MCH: 28.9 pg (ref 26.0–34.0)
MCHC: 32.7 g/dL (ref 30.0–36.0)
MCV: 88.1 fL (ref 78.0–100.0)
PLATELETS: 252 10*3/uL (ref 150–400)
RBC: 4.47 MIL/uL (ref 4.22–5.81)
RDW: 12.4 % (ref 11.5–15.5)
WBC: 5.9 10*3/uL (ref 4.0–10.5)

## 2015-04-03 LAB — VITAMIN B12: VITAMIN B 12: 646 pg/mL (ref 211–911)

## 2015-04-03 LAB — TSH: TSH: <0.008 u[IU]/mL — ABNORMAL LOW (ref 0.350–4.500)

## 2015-04-03 NOTE — ED Notes (Signed)
Pt states 2 weeks ago he started having a mild tremor in legs and hands worse when standing. While sitting in triage pt is not noted to have any tremor or shaking. Pt also states over the past 2 weeks he feels like he can hear his heart beating in his throat. But denies any chest pain or sob.

## 2015-04-03 NOTE — Telephone Encounter (Signed)
Pt didn't ask while he was here, he wants a refill on his Asprin

## 2015-04-03 NOTE — Progress Notes (Signed)
Subjective: Chief Complaint  Patient presents with  . body shaking    x3 weeks every day. when standing too long or walking up stairs. feels it mostly in legs. unsure if it is coming from previous back injury. not painful. hands shaking also. and can feel heartbeat in neck   Here today accompanied by wife.   He has hx/o lumbar disc disease and back pain.   However, wife is worried as he has new onset "shaking" and gait changes over the past 3 weeks.  She also notes he has lost weight in recent weeks.  He attributes weight to less junk in diet and more water intake.   I last saw him back in the summer for abdominal pain and blood in stool.  He was referred to GI but never went due to costs.     He notes in the past 3 weeks gets shakes/tremor in arms and legs.   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   He is a [employer] architect.  He is still able to do his job, but he notes these new concerning symptoms.   He has not dropped anything but has had trouble with action goals such as handing a glass to his wife in the kitchen.  Denies numbness, tingling, vision or hearing changes, no syncope, no mental status or cogitation changes.  He thought this was related to his hx/o bulging disc in the back that may have gotten worse.   He has had some palpitation, hears his heartbeat in his head.[facility] cardiology as well today.  He has seen cardiology in the past.     He still gets some blood in the stool, but no current abdominal pain.    He says he never heard back from [place] regarding sleep study from prior visit.  He does endorse polydipsia, some polyuria.  Eats on average 2 meals daily.  No alcohol or drug use.  No other aggravating or relieving factors. No other complaint.  Past Medical History  Diagnosis Date  . Disc disorder     "MRI bulging disc L4 and L5"   Past Surgical History  Procedure Laterality Date  . Appendectomy      Objective BP 134/72 mmHg  Pulse 78  Resp 14   Wt 182 lb 12.8 oz (82.918 kg)   Wt Readings from Last 3 Encounters:  04/03/15 182 lb 12.8 oz (82.918 kg)  08/21/14 198 lb (89.812 kg)  07/24/14 199 lb (90.266 kg)    General appearance: alert, no distress, WD/WN, AA male, seems a bit nervous HEENT: normocephalic, sclerae anicteric, PERRLA, EOMi, nares patent, no discharge or erythema, pharynx normal Oral cavity: MMM, no lesions Neck: supple, no lymphadenopathy, no thyromegaly, no masses, no bruits Heart: 2/6 brief holosystolic murmur, otherwise normal RRR, normal S1 and S2, otherwise Lungs: CTA bilaterally, no wheezes, rhonchi, or rales Abdomen: +bs, soft, RLQ surgical scar, non tender, non distended, no masses, no hepatomegaly, no splenomegaly Back: non tender Musculoskeletal: nontender, no swelling, no obvious deformity Extremities: no edema, no cyanosis, no clubbing Pulses: 2+ symmetric, upper extremities, 1+ pedal pulses, normal cap refill Neurological: alert, oriented x 3, CN2-12 intact, MMSE 27/30.   While standing there is a mild notice bale tremor of both legs, not observed at rest.   There is a action tremor worse at end goal.  No obvious resting arm tremor.   Gait is shuffled, new finding.   DTRs seem to be 3+  throughout, otherwise non focal psychiatric: pleasant  But guarded affect    Assessment Encounter Diagnoses  Name Primary?  . Tremor Yes  . Abnormality of gait   . Loss of weight   . Generalized weakness   . Polydipsia     Plan:  discussed their concerns, possible causes.   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   He still needs f/u from blood in stool with GI and sleep study as discussed back in the summer.

## 2015-04-04 ENCOUNTER — Emergency Department (HOSPITAL_COMMUNITY)
Admission: EM | Admit: 2015-04-04 | Discharge: 2015-04-04 | Disposition: A | Discharge status: Home / Self Care | Payer: BLUE CROSS/BLUE SHIELD | Attending: Emergency Medicine | Admitting: Emergency Medicine

## 2015-04-04 DIAGNOSIS — G259 Extrapyramidal and movement disorder, unspecified: Secondary | ICD-10-CM

## 2015-04-04 LAB — T4, FREE: FREE T4: 4.66 ng/dL — AB (ref 0.80–1.80)

## 2015-04-04 LAB — T3: T3 TOTAL: 521.5 ng/dL — AB (ref 80.0–204.0)

## 2015-04-04 LAB — HIV ANTIBODY (ROUTINE TESTING W REFLEX): HIV 1&2 Ab, 4th Generation: NONREACTIVE

## 2015-04-04 NOTE — ED Notes (Signed)
Pt c/o tremors x 3 weeks in his hands and legs. No tremors noted at this time.

## 2015-04-04 NOTE — Discharge Instructions (Signed)
Essential Tremor Jonathan Mcmahon, you likely have a movement disorder and this may be Parkinsons disease.  It is very important for you to see neurology within 3 days for close follow up.  We advise you not to drive until you are evaluated by them.  For any worsening symptoms, come back to the ED immediately.  Thank you. A tremor is trembling or shaking that you cannot control. Most tremors affect the hands or arms. Tremors can also affect the head, vocal cords, face, and other parts of the body.  Essential tremor is a tremor without a known cause.  CAUSES Essential tremor has no known cause.  RISK FACTORS You may be at greater risk of essential tremor if:   You have a family member with essential tremor.   You are age 43 or older.   You take certain medicines. SIGNS AND SYMPTOMS The main sign of a tremor is uncontrolled and unintentional rhythmic shaking of a body part.  You may have difficulty eating with a spoon or fork.   You may have difficulty writing.   You may nod your head up and down or side to side.   You may have a quivering voice.  Your tremors:  May get worse over time.   May come and go.   May be more noticeable on one side of your body.   May get worse due to stress, fatigue, caffeine, and extreme heat or cold.  DIAGNOSIS Your health care provider can diagnose essential tremor based on your symptoms, medical history, and a physical examination. There is no single test to diagnose an essential tremor. However, your health care provider may perform a variety of tests to rule out other conditions. Tests may include:   Blood and urine tests.   Imaging studies of your brain, such as:   CT scan.   MRI.   A test that measures involuntary muscle movement (electromyogram). TREATMENT Your tremors may go away without treatment. Mild tremors may not need treatment if they do not affect your day-to-day life. Severe tremors may need to be treated using one or  a combination of the following options:   Medicines. This may include medicine that is injected.  Lifestyle changes.   Physical therapy.  HOME CARE INSTRUCTIONS  Take medicines only as directed by your health care provider.   Limit alcohol intake to no more than 1 drink per day for nonpregnant women and 2 drinks per day for men. One drink equals 12 oz of beer, 5 oz of wine, or 1 oz of hard liquor.  Do not use any tobacco products, including cigarettes, chewing tobacco, or electronic cigarettes. If you need help quitting, ask your health care provider.  Take medicines only as directed by your health care provider.   Avoid extreme heat or cold.   Limit the amount of caffeine you consumeas directed by your health care provider.   Try to get eight hours of sleep each night.  Find ways to manage your stress, such as meditation or yoga.  Keep all follow-up visits as directed by your health care provider. This is important. This includes any physical therapy visits. SEEK MEDICAL CARE IF:  You experience any changes in the location or intensity of your tremors.   You start having a tremor after starting a new medicine.   You have tremor with other symptoms such as:   Numbness.   Tingling.   Pain.   Weakness.   Your tremor gets worse.  Your tremor interferes with your daily life.    This information is not intended to replace advice given to you by your health care provider. Make sure you discuss any questions you have with your health care provider.   Document Released: 04/07/2014 Document Reviewed: 04/07/2014 Elsevier Interactive Patient Education 2016 [employer] Parkinson Disease Parkinson disease is a disorder of the brain and spinal cord (central nervous system). The person will slowly lose the ability to control his or her body movements. This happens due to:  Damaged nerve cells.  Low levels of a certain brain chemical. HOME  CARE  Exercise often.  Make time to rest during the day.  Take all medicine as told by your doctor.  Replace buttons and zippers with elastic and Velcro if getting dressed is difficult.  Put grab bars or rails in your home. This helps you to not fall.  Go to speech therapy or therapy to help you with daily activities (occupational therapy). Do this as told by your doctor.  Keep all doctor visits as told. GET HELP IF:  Your medicine does not help your symptoms.  You fall.  You have trouble swallowing or choke on your food. MAKE SURE YOU:  Understand these instructions.  Will watch your condition.  Will get help right away if you are not doing well or get worse.   This information is not intended to replace advice given to you by your health care provider. Make sure you discuss any questions you have with your health care provider.   Document Released: 06/09/2011 Document Revised: 07/12/2012 Document Reviewed: 06/09/2011 Elsevier Interactive Patient Education [employer]

## 2015-04-04 NOTE — ED Notes (Signed)
Family member to nurse first stating the patient is shaking more than he was.

## 2015-04-04 NOTE — ED Provider Notes (Signed)
CSN: [id number]     Arrival date & time 04/03/15  2053 History  By signing my name below, I, Freida Busman, attest that this documentation has been prepared under the direction and in the presence of Tomasita Crumble, MD . Electronically Signed: Freida Busman, Scribe. 04/04/2015. 2:25 AM.    Chief Complaint  Patient presents with  . Tremors   The history is provided by the patient and the spouse. No language interpreter was used.     HPI Comments:  Jonathan Mcmahon is a 45 y.o. male who presents to the Emergency Department complaining of intermittent episodes of tremors x ~ 3 weeks. He reports shaking to his BLE and bilateral hands. He states he often has the episodes when he stands for long periods of times, stands on tippy toes and when he extends hands to pass objects. No alleviating factors noted. He denies cough, fever, rhinorrhea, and vomiting. Wife denies speech changes and facial droop.   Past Medical History  Diagnosis Date  . Disc disorder     "MRI bulging disc L4 and L5"   Past Surgical History  Procedure Laterality Date  . Appendectomy     Family History  Problem Relation Age of Onset  . Renal cancer Mother   . CAD Father   . Heart disease Father 71    died of MI  . Diabetes Father   . Gallbladder disease Neg Hx    Social History  Substance Use Topics  . Smoking status: Never Smoker   . Smokeless tobacco: None  . Alcohol Use: No    Review of Systems  10 systems reviewed and all are negative for acute change except as noted in the HPI.    Allergies  Review of patient's allergies indicates no known allergies.  Home Medications   Prior to Admission medications   Medication Sig Start Date End Date Taking? Authorizing Provider  aspirin 81 MG chewable tablet Chew 4 tablets (324 mg total) by mouth daily. Patient not taking: Reported on 07/24/2014 04/30/14   Linwood Dibbles, MD   BP 133/81 mmHg  Pulse 97  Temp(Src) 98.9 F (37.2 C) (Oral)  Resp 18  Ht 5\' 4"  (1.626 m)  Wt  182 lb 12.8 oz (82.918 kg)  BMI 31.36 kg/m2  SpO2 96% Physical Exam  Constitutional: He is oriented to person, place, and time. Vital signs are normal. He appears well-developed and well-nourished.  Non-toxic appearance. He does not appear ill. No distress.  HENT:  Head: Normocephalic and atraumatic.  Nose: Nose normal.  Mouth/Throat: Oropharynx is clear and moist. No oropharyngeal exudate.  Eyes: Conjunctivae and EOM are normal. Pupils are equal, round, and reactive to light. No scleral icterus.  Neck: Normal range of motion. Neck supple. No tracheal deviation, no edema, no erythema and normal range of motion present. No thyroid mass and no thyromegaly present.  Cardiovascular: Normal rate, regular rhythm, S1 normal, S2 normal, normal heart sounds, intact distal pulses and normal pulses.  Exam reveals no gallop and no friction rub.   No murmur heard. Pulmonary/Chest: Effort normal and breath sounds normal. No respiratory distress. He has no wheezes. He has no rhonchi. He has no rales.  Abdominal: Soft. Normal appearance and bowel sounds are normal. He exhibits no distension, no ascites and no mass. There is no hepatosplenomegaly. There is no tenderness. There is no rebound, no guarding and no CVA tenderness.  Musculoskeletal: Normal range of motion. He exhibits no edema or tenderness.  Lymphadenopathy:    He  has no cervical adenopathy.  Neurological: He is alert and oriented to person, place, and time. He has normal strength. No cranial nerve deficit or sensory deficit.  Normal strength and sensation to all extremities Normal cerebellar testing Shuffling gait noted  Skin: Skin is warm, dry and intact. No petechiae and no rash noted. He is not diaphoretic. No erythema. No pallor.  Psychiatric: He has a normal mood and affect. His behavior is normal. Judgment normal.  Nursing note and vitals reviewed.   ED Course  Procedures   DIAGNOSTIC STUDIES:  Oxygen Saturation is 99% on RA, normal  by my interpretation.    COORDINATION OF CARE:  2:16 AM Discussed treatment plan with pt at bedside and pt agreed to plan.  Labs Review Labs Reviewed  BASIC METABOLIC PANEL - Abnormal; Notable for the following:    Glucose, Bld 118 (*)    Creatinine, Ser 0.55 (*)    All other components within normal limits  CBC - Abnormal; Notable for the following:    Hemoglobin 12.9 (*)    All other components within normal limits  URINALYSIS, ROUTINE W REFLEX MICROSCOPIC (NOT AT [facility])    Imaging Review No results found. I have personally reviewed and evaluated these images and lab results as part of my medical decision-making.   EKG Interpretation None       2:22 AM Discussed case with neurology.   MDM   Final diagnoses:  None   Patient presents to the ED for new onset shuffling gait and an intention tremor.  I have concern for possible movement disorder , especially Parkinsons.  I discussed the case with on call neurology who agrees.  [facility] and he was referred there.  Advised not to drive until evaluated.  He demonstrates good understanding.  Education provided.  He appears well and in NAD.  VS remain within his normal limits and he is safe for DC.    I personally performed the services described in this documentation, which was scribed in my presence. The recorded information has been reviewed and is accurate.      Tomasita CrumbleAdeleke Casilda Pickerill, MD 04/04/15 1530

## 2015-04-05 ENCOUNTER — Ambulatory Visit (INDEPENDENT_AMBULATORY_CARE_PROVIDER_SITE_OTHER): Payer: BLUE CROSS/BLUE SHIELD | Admitting: Neurology

## 2015-04-05 ENCOUNTER — Encounter: Payer: Self-pay | Admitting: Neurology

## 2015-04-05 ENCOUNTER — Other Ambulatory Visit: Payer: Self-pay | Admitting: Medical

## 2015-04-05 VITALS — BP 136/64 | HR 89 | Resp 16 | Ht 65.0 in | Wt 182.0 lb

## 2015-04-05 DIAGNOSIS — R519 Headache, unspecified: Secondary | ICD-10-CM

## 2015-04-05 DIAGNOSIS — R0681 Apnea, not elsewhere classified: Secondary | ICD-10-CM | POA: Diagnosis not present

## 2015-04-05 DIAGNOSIS — E0590 Thyrotoxicosis, unspecified without thyrotoxic crisis or storm: Secondary | ICD-10-CM

## 2015-04-05 DIAGNOSIS — R0683 Snoring: Secondary | ICD-10-CM | POA: Diagnosis not present

## 2015-04-05 DIAGNOSIS — R002 Palpitations: Secondary | ICD-10-CM | POA: Diagnosis not present

## 2015-04-05 DIAGNOSIS — R51 Headache: Secondary | ICD-10-CM | POA: Diagnosis not present

## 2015-04-05 DIAGNOSIS — R351 Nocturia: Secondary | ICD-10-CM

## 2015-04-05 DIAGNOSIS — R251 Tremor, unspecified: Secondary | ICD-10-CM

## 2015-04-05 DIAGNOSIS — R269 Unspecified abnormalities of gait and mobility: Secondary | ICD-10-CM

## 2015-04-05 DIAGNOSIS — R634 Abnormal weight loss: Secondary | ICD-10-CM

## 2015-04-05 LAB — RPR

## 2015-04-05 MED ORDER — ASPIRIN 81 MG PO CHEW: 81.0000 mg | CHEWABLE_TABLET | Freq: Every day | ORAL | Status: DC

## 2015-04-05 NOTE — Telephone Encounter (Signed)
Have you seen this ?

## 2015-04-05 NOTE — Patient Instructions (Signed)
  Please remember, that any kind of tremor may be exacerbated by anxiety, anger, nervousness, excitement, dehydration, sleep deprivation, by caffeine, and low blood sugar values or blood sugar fluctuations and of course thyroid disease. Some medications, especially some antidepressants and lithium can cause or exacerbate tremors. Tremors may temporarily calm down her subside with the use of a benzodiazepine such as Valium or related medications and with alcohol. Be aware however that drinking alcohol is not an approved treatment or appropriate treatment for tremor control and long-term use of benzodiazepines such as Valium, lorazepam, alprazolam, or clonazepam can cause habit formation, physical and psychological addiction.  We will hold off on any medications from my end of things and await thyroid management results first. We will hold off of any imaging studies. You have not signs of parkinsonism.  Based on your symptoms and your exam I believe you are at risk for obstructive sleep apnea or OSA, and I think we should proceed with a sleep study to determine whether you do or do not have OSA and how severe it is. If you have more than mild OSA, I want you to consider treatment with CPAP. Please remember, the risks and ramifications of moderate to severe obstructive sleep apnea or OSA are: Cardiovascular disease, including congestive heart failure, stroke, difficult to control hypertension, arrhythmias, and even type 2 diabetes has been linked to untreated OSA. Sleep apnea causes disruption of sleep and sleep deprivation in most cases, which, in turn, can cause recurrent headaches, problems with memory, mood, concentration, focus, and vigilance. Most people with untreated sleep apnea report excessive daytime sleepiness, which can affect their ability to drive. Please do not drive if you feel sleepy.   I will likely see you back after your sleep study to go over the test results and where to go from there. We  will call you after your sleep study to advise about the results (most likely, you will hear from Lafonda Mossesiana, my nurse) and to set up an appointment at the time, as necessary.    Our sleep lab administrative assistant, Alvis LemmingsDawn will meet with you or call you to schedule your sleep study. If you don't hear back from her by next week please feel free to call her at [phone]. This is her direct line and please leave a message with your phone number to call back if you get the voicemail box. She will call back as soon as possible.

## 2015-04-05 NOTE — Progress Notes (Signed)
Subjective:    Patient ID: Jonathan BeachGerry Mcmahon is a 45 y.o. male.  HPI     Huston FoleySaima Judie Hollick, MD, PhD [facility] [address], [address] [address] [place], [place] [address]  Jonathan Mcmahon is a 45 year old right handed gentlemen, who presents for initial visit as a referral from the ER for tremors. He is accompanied by his wife today. He has an underlying medical history of degenerative disc disease of the lumbar spine and obesity, and reports an approximately three-week history of lower extremity tremors, leg weakness, tremors in both upper extremities, palpitations, nervousness, panic feeling, sweating, feeling hot and gait dysfunction. He presented to the emergency room on 04/04/2015 with a history of intermittent hand tremors of approximately 3 weeks' duration. I reviewed the emergency room records from 04/04/2015. The case was discussed with the neurologist on-call. Outpatient evaluation for an underlying movement disorder was recommended. He had labs on 04/03/15 through his PCP, which I reviewed: HIV nonreactive, RPR nonreactive, B12 646, TSH abnormal at less than 0.008, free T4 elevated at 4.66, T3 abnormal at 521.5. He has an appointment with cardiology because he has experienced palpitations. 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 He tries to hydrate well when he drives. He does not sleep well however and snores. His wife has noticed witnessed breathing pauses while he is asleep. He has woken himself up with a sense of panic and palpitations as well as chest pain in the recent past. He wakes up with a headache which is a pressure-like sensation occasionally. He goes to the bathroom at night usually once or twice on an average night. He feels a little better with respect to his trembling today. His wife agrees that his walking and his tremors are better today.  His Past Medical History Is Significant For: Past Medical History  Diagnosis Date   . 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 Fatty liver   . Cyst of left kidney     His Past Surgical History Is Significant For: Past Surgical History  Procedure Laterality Date  . Appendectomy      His Family History Is Significant For: Family History  Problem Relation Age of Onset  . Renal cancer Mother   . CAD Father   . Heart disease Father 2457    died of MI  . Diabetes Father   . Gallbladder disease Neg Hx     Her Social History Is Significant For: Social History   Social History  . Marital Status: Married    Spouse Name: N/A  . Number of Children: 3  . Years of Education: 12   Occupational History  . Truck Hospital doctorDriver     Social History Main Topics  . Smoking status: Never Smoker   . Smokeless tobacco: None  . Alcohol Use: No  . Drug Use: No  . Sexual Activity: Not Asked   Other Topics Concern  . None   Social History Narrative   Walks for exercise some.  Recent changed eating habits to more healthy.  Married, truck driver, has 3 kids, ages 47WG18yo, 43yo, 15yo as of 06/2014.     Drinks about a couple cups of tea a week     His Allergies Are:  No Known Allergies:   His Current Medications Are:  Outpatient Encounter Prescriptions as of 04/05/2015  Medication Sig  . aspirin 81 MG chewable tablet Chew 4 tablets (  324 mg total) by mouth daily.   No facility-administered encounter medications on file as of 04/05/2015.  :   Review of Systems:  Out of a complete 14 point review of systems, all are reviewed and negative with the exception of these symptoms as listed below:   Review of Systems  Respiratory: Positive for shortness of breath.   Cardiovascular: Positive for palpitations.       Has appt today with cardiologist for heart palpitations.   Neurological:       Patient reports that about 3 weeks ago he started to notice symptoms of bilateral leg weakness and tremors, also tremors in both arms. Patient had recent blood work done showing that his  thyroid levels are low.     Objective:  Neurologic Exam  Physical Exam Physical Examination:   Filed Vitals:   04/05/15 0849  BP: 136/64  Pulse: 89  Resp: 16   General Examination: The patient is a very pleasant 45 y.o. male in no acute distress. He appears well-developed and well-nourished and well groomed. He is mildly anxious and nervous appearing.  HEENT: Normocephalic, atraumatic, pupils are equal, round and reactive to light and accommodation. Funduscopic exam is normal with sharp disc margins noted. Extraocular tracking is good without limitation to gaze excursion or nystagmus noted. Normal smooth pursuit is noted. Hearing is grossly intact. Tympanic membranes are clear bilaterally. Face is symmetric with normal facial animation and normal facial sensation. Speech is clear with no dysarthria noted. There is no hypophonia. There is no lip, neck/head, jaw or voice tremor. Neck is supple with full range of passive and active motion. There are no carotid bruits on auscultation. Oropharynx exam reveals: mild mouth dryness, adequate dental hygiene and moderate airway crowding, due to larger uvula and larger tonsils, about 3+ bilaterally. Tongue is normal in size. Mallampati is class II. Tongue protrudes centrally and palate elevates symmetrically. Neck size is 16.25 inches.   Chest: Clear to auscultation without wheezing, rhonchi or crackles noted.  Heart: S1+S2+0, regular and normal without murmurs, rubs or gallops noted.   Abdomen: Soft, non-tender and non-distended with normal bowel sounds appreciated on auscultation.  Extremities: There is no pitting edema in the distal lower extremities bilaterally. Pedal pulses are intact.  Skin: Warm and dry without trophic changes noted.   Musculoskeletal: exam reveals no obvious joint deformities, tenderness or joint swelling or erythema.   Neurologically:  Mental status: The patient is awake, alert and oriented in all 4 spheres. His  immediate and remote memory, attention, language skills and fund of knowledge are appropriate. There is no evidence of aphasia, agnosia, apraxia or anomia. Speech is clear with normal prosody and enunciation. Thought process is linear. Mood is anxious and affect is blunted.  Cranial nerves II - XII are as described above under HEENT exam. In addition: shoulder shrug is normal with equal shoulder height noted. Motor exam: Normal bulk, strength and tone is noted. There is no drift, resting tremor or rebound. He has on Archimedes spiral drawing a very fine degree of tremulousness bilaterally. Handwriting is not micrographic and is legible without significant tremor noted. He has a intermittent mild bilateral upper extremity postural tremor and very little action tremor. In the lower extremities he has a little bit of foot trembling and leg bobbing. Romberg is negative. Reflexes are 2+ throughout. Babinski: Toes are flexor bilaterally. Fine motor skills and coordination: intact with normal finger taps, normal hand movements, normal rapid alternating patting, normal foot taps and normal foot  agility.  Cerebellar testing: No dysmetria or intention tremor on finger to nose testing. Heel to shin is unremarkable bilaterally. There is no truncal or gait ataxia.  Sensory exam: intact to light touch, pinprick, vibration, temperature sense in the upper and lower extremities.  Gait, station and balance: He stands easily. He walks slowly and cautiously. Tandem walk is possible but very slow. He has preserved arm swing when walking. Posture appears a little stooped for age.  Assessment and Plan:    In summary, Jonathan Mcmahon is a very pleasant 45 y.o.-year old male with an underlying medical history of degenerative disc disease of the lumbar spine and obesity, and reports an approximately three-week history of lower extremity tremors, leg weakness, tremors in both upper extremities, palpitations, nervousness, panic feeling,  sweating, feeling hot and gait dysfunction. He has recently been diagnosed with hyperthyroidism. Further workup and management is still pending. He has an appointment with cardiology. On examination he has no evidence of parkinsonism. He has a subtle postural tremor with a small or action component. This could be secondary to underlying thyroid disease. I would not recommend any new medication from my end of things and we will await management for his thyroid dysfunction first. I do not think we need to push for a brain scan. His neurological exam and general exam otherwise are nonfocal. His history and physical exam are also concerning for underlying obstructive sleep apnea. He endorses snoring, waking up with a sense of panic, palpitations, nocturia, dull morning headaches, and his wife has noticed pauses in his breathing while he is asleep. We will proceed with a sleep study and we talked about sleep apnea management including CPAP treatment. He would be willing to try CPAP if necessary. I will see him back routinely after his sleep study and after he has had evaluation further management of his thyroid dysfunction. We talked about potential tremor triggers as well. We will continue to monitor his symptoms. I answered all her questions today and the patient and his wife were in agreement.  Thank you very much for allowing me to participate in the care of this nice patient. If I can be of any further assistance to you please do not hesitate to call me at [phone].  Sincerely,   Huston Foley, MD, PhD

## 2015-04-06 ENCOUNTER — Other Ambulatory Visit: Payer: Self-pay | Admitting: Medical

## 2015-04-06 ENCOUNTER — Ambulatory Visit
Admission: RE | Admit: 2015-04-06 | Discharge: 2015-04-06 | Disposition: A | Discharge status: Home / Self Care | Payer: BLUE CROSS/BLUE SHIELD | Source: Ambulatory Visit | Attending: Medical | Admitting: Medical

## 2015-04-06 ENCOUNTER — Telehealth: Payer: Self-pay | Admitting: Medical

## 2015-04-06 ENCOUNTER — Ambulatory Visit (INDEPENDENT_AMBULATORY_CARE_PROVIDER_SITE_OTHER): Payer: BLUE CROSS/BLUE SHIELD | Admitting: Medical

## 2015-04-06 ENCOUNTER — Encounter: Payer: Self-pay | Admitting: Medical

## 2015-04-06 VITALS — BP 130/90 | HR 100 | Wt 182.0 lb

## 2015-04-06 DIAGNOSIS — K921 Melena: Secondary | ICD-10-CM

## 2015-04-06 DIAGNOSIS — R631 Polydipsia: Secondary | ICD-10-CM | POA: Diagnosis not present

## 2015-04-06 DIAGNOSIS — R531 Weakness: Secondary | ICD-10-CM | POA: Diagnosis not present

## 2015-04-06 DIAGNOSIS — E0590 Thyrotoxicosis, unspecified without thyrotoxic crisis or storm: Secondary | ICD-10-CM

## 2015-04-06 DIAGNOSIS — E079 Disorder of thyroid, unspecified: Secondary | ICD-10-CM | POA: Insufficient documentation

## 2015-04-06 DIAGNOSIS — R251 Tremor, unspecified: Secondary | ICD-10-CM

## 2015-04-06 DIAGNOSIS — R269 Unspecified abnormalities of gait and mobility: Secondary | ICD-10-CM

## 2015-04-06 DIAGNOSIS — R002 Palpitations: Secondary | ICD-10-CM | POA: Diagnosis not present

## 2015-04-06 DIAGNOSIS — R634 Abnormal weight loss: Secondary | ICD-10-CM | POA: Diagnosis not present

## 2015-04-06 DIAGNOSIS — R109 Unspecified abdominal pain: Secondary | ICD-10-CM | POA: Diagnosis not present

## 2015-04-06 MED ORDER — PROPRANOLOL HCL 20 MG PO TABS: 20.0000 mg | ORAL_TABLET | Freq: Two times a day (BID) | ORAL | Status: DC

## 2015-04-06 MED ORDER — METHIMAZOLE 5 MG PO TABS: 5.0000 mg | ORAL_TABLET | Freq: Three times a day (TID) | ORAL | Status: DC

## 2015-04-06 NOTE — Patient Instructions (Signed)
Hyperthyroidism Hyperthyroidism is when the thyroid is too active (overactive). Your thyroid is a large gland that is located in your neck. The thyroid helps to control how your body uses food (metabolism). When your thyroid is overactive, it produces too much of a hormone called thyroxine.  CAUSES Causes of hyperthyroidism may include:  Graves disease. This is when your immune system attacks the thyroid gland. This is the most common cause.  Inflammation of the thyroid gland.  Tumor in the thyroid gland or somewhere else.  Excessive use of thyroid medicines, including:  Prescription thyroid supplement.  Herbal supplements that mimic thyroid hormones.  Solid or fluid-filled lumps within your thyroid gland (thyroid nodules).  Excessive ingestion of iodine. RISK FACTORS  Being male.  Having a family history of thyroid conditions. SIGNS AND SYMPTOMS Signs and symptoms of hyperthyroidism may include:  Nervousness.  Inability to tolerate heat.  Unexplained weight loss.  Diarrhea.  Change in the texture of hair or skin.  Heart skipping beats or making extra beats.  Rapid heart rate.  Loss of menstruation.  Shaky hands.  Fatigue.  Restlessness.  Increased appetite.  Sleep problems.  Enlarged thyroid gland or nodules. DIAGNOSIS  Diagnosis of hyperthyroidism may include:  Medical history and physical exam.  Blood tests.  Ultrasound tests. TREATMENT Treatment may include:  Medicines to control your thyroid.  Surgery to remove your thyroid.  Radiation therapy. HOME CARE INSTRUCTIONS   Take medicines only as directed by your health care provider.  Do not use any tobacco products, including cigarettes, chewing tobacco, or electronic cigarettes. If you need help quitting, ask your health care provider.  Do not exercise or do physical activity until your health care provider approves.  Keep all follow-up appointments as directed by your health care  provider. This is important. SEEK MEDICAL CARE IF:  Your symptoms do not get better with treatment.  You have fever.  You are taking thyroid replacement medicine and you:  Have depression.  Feel mentally and physically slow.  Have weight gain. SEEK IMMEDIATE MEDICAL CARE IF:   You have decreased alertness or a change in your awareness.  You have abdominal pain.  You feel dizzy.  You have a rapid heartbeat.  You have an irregular heartbeat.   This information is not intended to replace advice given to you by your health care provider. Make sure you discuss any questions you have with your health care provider.   Document Released: 03/17/2005 Document Revised: 04/07/2014 Document Reviewed: 08/02/2013 Elsevier Interactive Patient Education 2016 [employer]vier Inc.  

## 2015-04-06 NOTE — Telephone Encounter (Signed)
Dr Sharyn LullHarwani stated that it was fine, said that he was supposed to already have the event monitor

## 2015-04-06 NOTE — Progress Notes (Signed)
Subjective: Chief Complaint  Patient presents with  . discuss labs    pt has u/s scheduled today.   Here for f/u on abnormal thyroid labs.    I saw him recently for a host of new symptoms, and he ended up going to the ED the day I saw him for worse tremor, and subsequently saw neurology yesterday, and has sleep study pending.  He has had new onset "shaking" and gait changes over the past 3 weeks, lost weight in recent weeks.  He notes in the past 3 weeks gets shakes/tremor in arms and legs.   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   He is a Naval architecttruck driver.  He is still able to do his job, but he notes these new concerning symptoms.   He has not dropped anything but has had trouble with action goals such as handing a glass to his wife in the kitchen.  Denies numbness, tingling, vision or hearing changes, no syncope, no mental status or cogitation changes.  He thought this was related to his hx/o bulging disc in the back that may have gotten worse.   He has had some palpitation, hears his heartbeat in his head.Marland Kitchen.  He saw cardiology 2 days ago, had echo, but hasn't heard back on results .Additionally, I saw him back in the summer for abdominal pain and blood in stool.  He was referred to GI but never went due to costs.    He still gets some blood in the stool, but no current abdominal pain.    No other aggravating or relieving factors. No other complaint.  Past Medical History  Diagnosis Date  . 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 Fatty liver   . Cyst of left kidney    Past Surgical History  Procedure Laterality Date  . Appendectomy      Objective BP 130/90 mmHg  Pulse 100  Wt 182 lb (82.555 kg)  General appearance: alert, no distress, WD/WN, AA male, seems a bit nervous HEENT: normocephalic, sclerae anicteric, PERRLA, EOMi, nares patent, no discharge or erythema, pharynx normal Oral cavity: MMM, no lesions Neck: supple, no lymphadenopathy, no  thyromegaly, no masses, no bruits Heart: 2/6 brief holosystolic murmur, otherwise normal RRR, normal S1 and S2, otherwise Lungs: CTA bilaterally, no wheezes, rhonchi, or rales Abdomen: +bs, soft, RLQ surgical scar, non tender, non distended, no masses, no hepatomegaly, no splenomegaly Back: non tender Musculoskeletal: nontender, no swelling, no obvious deformity Extremities: no edema, no cyanosis, no clubbing Pulses: 2+ symmetric, upper extremities, 1+ pedal pulses, normal cap refill Neurological: alert, oriented x 3, CN2-12 intact, MMSE 27/30.   While standing there is a mild notice bale tremor of both legs, not observed at rest.   There is a action tremor worse at end goal.  No obvious resting arm tremor.   Gait is shuffled, new finding.   DTRs seem to be 3+ throughout, otherwise non focal psychiatric: pleasant  But guarded affect    Assessment Encounter Diagnoses  Name Primary?  . Hyperthyroidism Yes  . Generalized weakness   . Loss of weight   . Polydipsia   . Abnormality of gait   . Tremor   . Palpitations   . Blood in stool   . Right sided abdominal pain     Plan:  reviewed the recent labs, the abnormal thyroid findings .    He will go for the thyroid ultrasound  today, additional labs today.  Begin Propranolol for palpitations.   Pending ultrasound, will likely begin Methimazole.   We will go ahead and refer to endocrinology.  Will need to complete sleep study with neurology as planned, he is awaiting echo results from cardiology and will be wearing event monitor, and he will still need to f/u with GI in the next month or so regarding blood in stool.   He understands and agrees with plan.  Currently he doesn't feel like symptoms are interfering with job responsible but he will keep [place] posted.

## 2015-04-06 NOTE — Telephone Encounter (Signed)
Call Dr. Katrinka BlazingHarwanis office (cardiology) and make sure Dr. Sharyn LullHarwani is ok for me to start Propranolol given his palpitations and new onset hyperthyroidism.  I'm calling as he is suppose to be starting Holter Monitor.

## 2015-04-07 LAB — THYROID PEROXIDASE ANTIBODY

## 2015-04-07 LAB — THYROGLOBULIN ANTIBODY: THYROGLOBULIN AB: 1 [IU]/mL (ref <2)

## 2015-04-10 ENCOUNTER — Telehealth: Payer: Self-pay

## 2015-04-10 NOTE — Telephone Encounter (Signed)
Pt states he has feeling weak in his legs. Trying to see how long till the medication starts working. 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

## 2015-04-11 ENCOUNTER — Telehealth: Payer: Self-pay

## 2015-04-11 NOTE — Telephone Encounter (Signed)
pls write out of work note x 3 days

## 2015-04-11 NOTE — Telephone Encounter (Signed)
Work note faxed stated 4 days but that would include today as day one. Pt is aware

## 2015-04-11 NOTE — Telephone Encounter (Signed)
That is what he wanted was stating that he could return in 3-4 days. That was what he told me anyways so that his medication had time to start working

## 2015-04-11 NOTE — Telephone Encounter (Signed)
Pt called needing a note saying he can go back to work being on his meds. He is a Naval architecttruck driver. He needs this note ASAP and needs it faxed to his job ATTN: Maximino GreenlandGreg Hall [phone]

## 2015-04-11 NOTE — Telephone Encounter (Signed)
Endocrinology appt tomorrow at 9:15 am. Will come by to pick up note for out of work this afternoon. Is taking the methimazole as directed.

## 2015-04-11 NOTE — Telephone Encounter (Signed)
I can write him out of work for 3-4 days if needed to give medication time to start working.   Make sure he is taking the Methimazole as directed and verify/hunt down appt time for endocrinology so I know.

## 2015-04-11 NOTE — Telephone Encounter (Signed)
I thought he wanted out of work note for a few days?   If he is seeing endocrinology tomorrow, I would defer work recommendations to them.   As of today and tomorrow, if he needs to be out of work for a day or 2 til he discussed with endocrinology, then let me know.

## 2015-04-12 ENCOUNTER — Encounter: Payer: Self-pay | Admitting: Endocrinology

## 2015-04-12 ENCOUNTER — Ambulatory Visit (INDEPENDENT_AMBULATORY_CARE_PROVIDER_SITE_OTHER): Payer: BLUE CROSS/BLUE SHIELD | Admitting: Endocrinology

## 2015-04-12 VITALS — BP 130/78 | HR 84 | Temp 98.4°F | Ht 65.0 in | Wt 180.0 lb

## 2015-04-12 DIAGNOSIS — E0590 Thyrotoxicosis, unspecified without thyrotoxic crisis or storm: Secondary | ICD-10-CM | POA: Diagnosis not present

## 2015-04-12 MED ORDER — METHIMAZOLE 10 MG PO TABS: 40.0000 mg | ORAL_TABLET | Freq: Two times a day (BID) | ORAL | Status: DC

## 2015-04-12 NOTE — Patient Instructions (Addendum)
i have sent a prescription to your pharmacy, to increase the methimazole. if ever you have fever while taking methimazole, stop it and call us, even if the reason is obvious, because of the risk of a rare side-effect. Please continue the same propranolol.  Please come back for a follow-up appointment in 2-3 weeks.   We can consider the radioactive iodine in the future, if you wish.     Hyperthyroidism Hyperthyroidism is when the thyroid is too active (overactive). Your thyroid is a large gland that is located in your neck. The thyroid helps to control how your body uses food (metabolism). When your thyroid is overactive, it produces too much of a hormone called thyroxine.  CAUSES Causes of hyperthyroidism may include:  Graves disease. This is when your immune system attacks the thyroid gland. This is the most common cause.  Inflammation of the thyroid gland.  Tumor in the thyroid gland or somewhere else.  Excessive use of thyroid medicines, including:  Prescription thyroid supplement.  Herbal supplements that mimic thyroid hormones.  Solid or fluid-filled lumps within your thyroid gland (thyroid nodules).  Excessive ingestion of iodine. RISK FACTORS  Being male.  Having a family history of thyroid conditions. SIGNS AND SYMPTOMS Signs and symptoms of hyperthyroidism may include:  Nervousness.  Inability to tolerate heat.  Unexplained weight loss.  Diarrhea.  Change in the texture of hair or skin.  Heart skipping beats or making extra beats.  Rapid heart rate.  Loss of menstruation.  Shaky hands.  Fatigue.  Restlessness.  Increased appetite.  Sleep problems.  Enlarged thyroid gland or nodules. DIAGNOSIS  Diagnosis of hyperthyroidism may include:  Medical history and physical exam.  Blood tests.  Ultrasound tests. TREATMENT Treatment may include:  Medicines to control your thyroid.  Surgery to remove your thyroid.  Radiation therapy. HOME  CARE INSTRUCTIONS   Take medicines only as directed by your health care provider.  Do not use any tobacco products, including cigarettes, chewing tobacco, or electronic cigarettes. If you need help quitting, ask your health care provider.  Do not exercise or do physical activity until your health care provider approves.  Keep all follow-up appointments as directed by your health care provider. This is important. SEEK MEDICAL CARE IF:  Your symptoms do not get better with treatment.  You have fever.  You are taking thyroid replacement medicine and you:  Have depression.  Feel mentally and physically slow.  Have weight gain. SEEK IMMEDIATE MEDICAL CARE IF:   You have decreased alertness or a change in your awareness.  You have abdominal pain.  You feel dizzy.  You have a rapid heartbeat.  You have an irregular heartbeat.   This information is not intended to replace advice given to you by your health care provider. Make sure you discuss any questions you have with your health care provider.   Document Released: 03/17/2005 Document Revised: 04/07/2014 Document Reviewed: 08/02/2013 Elsevier Interactive Patient Education [employer] Elsevier Inc.

## 2015-04-12 NOTE — Progress Notes (Signed)
Subjective:    Patient ID: Jonathan Mcmahon, male    DOB: 02/13/1971, 45 y.o.   MRN: [id number]  HPI Pt reports he was dx'ed with hyperthyroidism in early 2017.  he has no prior h/o thyroid problems.  He has never been on therapy for this.  He has never had XRT to the anterior neck, or thyroid surgery.  He does not consume kelp or any other prescribed or non-prescribed thyroid medication.  He has never been on amiodarone.  He works as a Biomedical scientist.  He has moderate tremor of the hands, and assoc muscle weakness.  He was rx'ed tapazole.   Past Medical History  Diagnosis Date  . 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 Fatty liver   . Cyst of left kidney     Past Surgical History  Procedure Laterality Date  . Appendectomy      Social History   Social History  . Marital Status: Married    Spouse Name: N/A  . Number of Children: 3  . Years of Education: 12   Occupational History  . Truck [employer] doctor     Social History Main Topics  . Smoking status: Never Smoker   . Smokeless tobacco: Not on file  . Alcohol Use: No  . Drug Use: No  . Sexual Activity: Not on file   Other Topics Concern  . Not on file   Social History Narrative   Walks for exercise some.  Recent changed eating habits to more healthy.  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     Drinks about a couple cups of tea a week     Current Outpatient Prescriptions on File Prior to Visit  Medication Sig Dispense Refill  . aspirin 81 MG chewable tablet Chew 1 tablet (81 mg total) by mouth daily. 30 tablet 3  . propranolol (INDERAL) 20 MG tablet Take 1 tablet (20 mg total) by mouth 2 (two) times daily. 60 tablet 2   No current facility-administered medications on file prior to visit.    No Known Allergies  Family History  Problem Relation Age of Onset  . Renal cancer Mother   . CAD Father   . Heart disease Father 33    died of MI  . Diabetes Father   . Gallbladder  disease Neg Hx   . Thyroid disease Neg Hx     BP 130/78 mmHg  Pulse 84  Temp(Src) 98.4 F (36.9 C) (Oral)  Ht 5\' 5"  (1.651 m)  Wt 180 lb (81.647 kg)  BMI 29.95 kg/m2  SpO2 96%  Review of Systems denies headache, visual loss, sob, diarrhea, polyuria, numbness, myalgias, anxiety, easy bruising, and rhinorrhea. He has intermittent hoarseness, palpitations, weight loss (20 lbs), excessive diaphoresis, heat intolerance, and fatigue.      Objective:   Physical Exam VS: see vs page GEN: no distress HEAD: head: no deformity eyes: no periorbital swelling, no proptosis external nose and ears are normal mouth: no lesion seen NECK: thyroid is slightly and diffusely enlarged CHEST WALL: no deformity LUNGS: clear to auscultation BREASTS:  No gynecomastia CV: reg rate and rhythm, no murmur ABD: abdomen is soft, nontender.  no hepatosplenomegaly.  not distended.  no hernia MUSCULOSKELETAL: muscle strength is decreased, especially at the thighs.  no obvious joint swelling.  gait is normal and steady EXTEMITIES: no deformity.  no edema PULSES: no carotid bruit NEURO:  cn 2-12 grossly intact.  readily moves all 4's.  sensation is intact to touch on all 4's. Slight tremor of the hands  SKIN:  Normal texture and temperature.  No rash or suspicious lesion is visible.  Not diaphoretic.   NODES:  None palpable at the neck PSYCH: alert, well-oriented.  Does not appear anxious nor depressed.  Lab Results  Component Value Date   TSH <0.008* 04/03/2015   T3TOTAL 521.5* 04/03/2015   US: Heterogeneous and enlarged gland without focal nodule.   I have reviewed outside records, and summarized: Pt was noted to have hyperthyrodism, and referred here.     Assessment & Plan:  Hyperthyroidism, new, due to Grave's dz.  Too hyperthyroid now for I-131 rx.  We discussed rx options for the future, though.    Patient is advised the following: Patient Instructions  i have sent a prescription to your  pharmacy, to increase the methimazole. if ever you have fever while taking methimazole, stop it and call us, even if the reason is obvious, because of the risk of a rare side-effect. Please continue the same propranolol.  Please come back for a follow-up appointment in 2-3 weeks.   We can consider the radioactive iodine in the future, if you wish.     Hyperthyroidism Hyperthyroidism is when the thyroid is too active (overactive). Your thyroid is a large gland that is located in your neck. The thyroid helps to control how your body uses food (metabolism). When your thyroid is overactive, it produces too much of a hormone called thyroxine.  CAUSES Causes of hyperthyroidism may include:  Graves disease. This is when your immune system attacks the thyroid gland. This is the most common cause.  Inflammation of the thyroid gland.  Tumor in the thyroid gland or somewhere else.  Excessive use of thyroid medicines, including:  Prescription thyroid supplement.  Herbal supplements that mimic thyroid hormones.  Solid or fluid-filled lumps within your thyroid gland (thyroid nodules).  Excessive ingestion of iodine. RISK FACTORS  Being male.  Having a family history of thyroid conditions. SIGNS AND SYMPTOMS Signs and symptoms of hyperthyroidism may include:  Nervousness.  Inability to tolerate heat.  Unexplained weight loss.  Diarrhea.  Change in the texture of hair or skin.  Heart skipping beats or making extra beats.  Rapid heart rate.  Loss of menstruation.  Shaky hands.  Fatigue.  Restlessness.  Increased appetite.  Sleep problems.  Enlarged thyroid gland or nodules. DIAGNOSIS  Diagnosis of hyperthyroidism may include:  Medical history and physical exam.  Blood tests.  Ultrasound tests. TREATMENT Treatment may include:  Medicines to control your thyroid.  Surgery to remove your thyroid.  Radiation therapy. HOME CARE INSTRUCTIONS   Take medicines  only as directed by your health care provider.  Do not use any tobacco products, including cigarettes, chewing tobacco, or electronic cigarettes. If you need help quitting, ask your health care provider.  Do not exercise or do physical activity until your health care provider approves.  Keep all follow-up appointments as directed by your health care provider. This is important. SEEK MEDICAL CARE IF:  Your symptoms do not get better with treatment.  You have fever.  You are taking thyroid replacement medicine and you:  Have depression.  Feel mentally and physically slow.  Have weight gain. SEEK IMMEDIATE MEDICAL CARE IF:   You have decreased alertness or a change in your awareness.  You have abdominal pain.  You feel dizzy.  You have a rapid heartbeat.  You have an irregular  heartbeat.   This information is not intended to replace advice given to you by your health care provider. Make sure you discuss any questions you have with your health care provider.   Document Released: 03/17/2005 Document Revised: 04/07/2014 Document Reviewed: 08/02/2013 Elsevier Interactive Patient Education [employer]

## 2015-04-13 ENCOUNTER — Encounter: Payer: Self-pay | Admitting: Endocrinology

## 2015-04-13 ENCOUNTER — Telehealth: Payer: Self-pay

## 2015-04-13 ENCOUNTER — Telehealth: Payer: Self-pay | Admitting: Endocrinology

## 2015-04-13 DIAGNOSIS — Z7689 Persons encountering health services in other specified circumstances: Secondary | ICD-10-CM

## 2015-04-13 NOTE — Telephone Encounter (Signed)
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

## 2015-04-13 NOTE — Telephone Encounter (Signed)
LMTCB

## 2015-04-13 NOTE — Telephone Encounter (Signed)
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

## 2015-04-13 NOTE — Telephone Encounter (Signed)
Pt is aware and will be reaching out to endocrinology

## 2015-04-13 NOTE — Telephone Encounter (Signed)
I will have to defer this to endocrinology now.

## 2015-04-13 NOTE — Telephone Encounter (Signed)
Dawn please clarify what the pt needs the letter to say.

## 2015-04-17 DIAGNOSIS — Z7689 Persons encountering health services in other specified circumstances: Secondary | ICD-10-CM

## 2015-04-18 NOTE — Telephone Encounter (Signed)
This has been done.

## 2015-04-20 ENCOUNTER — Encounter: Payer: Self-pay | Admitting: Endocrinology

## 2015-04-20 ENCOUNTER — Ambulatory Visit (INDEPENDENT_AMBULATORY_CARE_PROVIDER_SITE_OTHER): Payer: BLUE CROSS/BLUE SHIELD | Admitting: Endocrinology

## 2015-04-20 VITALS — BP 132/64 | HR 94 | Temp 98.1°F | Ht 65.0 in | Wt 180.0 lb

## 2015-04-20 DIAGNOSIS — E0590 Thyrotoxicosis, unspecified without thyrotoxic crisis or storm: Secondary | ICD-10-CM | POA: Diagnosis not present

## 2015-04-20 LAB — T4, FREE: Free T4: 2.08 ng/dL — ABNORMAL HIGH (ref 0.60–1.60)

## 2015-04-20 LAB — TSH: TSH: 0.15 u[IU]/mL — ABNORMAL LOW (ref 0.35–4.50)

## 2015-04-20 MED ORDER — TRIAMCINOLONE ACETONIDE 0.1 % EX CREA: 1.0000 "application " | TOPICAL_CREAM | Freq: Four times a day (QID) | CUTANEOUS | Status: DC | PRN

## 2015-04-20 MED ORDER — PROPRANOLOL HCL ER 60 MG PO CP24: 60.0000 mg | ORAL_CAPSULE | Freq: Every day | ORAL | Status: DC

## 2015-04-20 NOTE — Patient Instructions (Signed)
i have sent 2 prescriptions to your pharmacy: to increase the propranolol, and for an anti-itch skin cream. blood tests are requested for you today.  We'll let you know about the results. Please come back for a follow-up appointment in 1 month. We can consider the radioactive iodine pill when the thyroid levels are better.

## 2015-04-20 NOTE — Progress Notes (Signed)
   Subjective:    Patient ID: Jonathan Mcmahon, male    DOB: Aug 26, 1970, 45 y.o.   MRN: [id number]  HPI Pt returns for f/u of hyperthyroidism (dx'ed early 2017; [place] was c/w Grave's Dz; he works as a Biomedical scientist; tapazole and inderal were chosen as initial rx, due to severity). Since on the 2 meds, he does any better in general.  In particular, he still has palpitations.  He also has moderate itching on the trunk of the body, but no assoc rash.  She says he never misses the tapazole.  Past Medical History  Diagnosis Date  . 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 Fatty liver   . Cyst of left kidney     Past Surgical History  Procedure Laterality Date  . Appendectomy      Social History   Social History  . Marital Status: Married    Spouse Name: N/A  . Number of Children: 3  . Years of Education: 12   Occupational History  . Truck [employer] doctor     Social History Main Topics  . Smoking status: Never Smoker   . Smokeless tobacco: Not on file  . Alcohol Use: No  . Drug Use: No  . Sexual Activity: Not on file   Other Topics Concern  . Not on file   Social History Narrative   Walks for exercise some.  Recent changed eating habits to more healthy.  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     Drinks about a couple cups of tea a week     Current Outpatient Prescriptions on File Prior to Visit  Medication Sig Dispense Refill  . aspirin 81 MG chewable tablet Chew 1 tablet (81 mg total) by mouth daily. 30 tablet 3  . methimazole (TAPAZOLE) 10 MG tablet Take 4 tablets (40 mg total) by mouth 2 (two) times daily. 240 tablet 1   No current facility-administered medications on file prior to visit.    No Known Allergies  Family History  Problem Relation Age of Onset  . Renal cancer Mother   . CAD Father   . Heart disease Father 22    died of MI  . Diabetes Father   . Gallbladder disease Neg Hx   . Thyroid disease Neg Hx     BP 132/64  mmHg  Pulse 94  Temp(Src) 98.1 F (36.7 C) (Oral)  Ht  (1.651 m)  Wt 180 lb (81.647 kg)  BMI 29.95 kg/m2  SpO2 96%  Review of Systems Denies fever and sob    Objective:   Physical Exam VITAL SIGNS:  See vs page GENERAL: no distress.  Skin: no rash.   Lab Results  Component Value Date   TSH 0.15* 04/20/2015   T3TOTAL 521.5* 04/03/2015       Assessment & Plan:  Hyperthyroidism: improved  Patient is advised the following: Patient Instructions  i have sent 2 prescriptions to your pharmacy: to increase the propranolol, and for an anti-itch skin cream. blood tests are requested for you today.  We'll let you know about the results. Please come back for a follow-up appointment in 1 month. We can consider the radioactive iodine pill when the thyroid levels are better.  addendum: Please continue the same tapazole.

## 2015-04-29 ENCOUNTER — Ambulatory Visit (INDEPENDENT_AMBULATORY_CARE_PROVIDER_SITE_OTHER): Payer: BLUE CROSS/BLUE SHIELD | Admitting: Neurology

## 2015-04-29 DIAGNOSIS — G4720 Circadian rhythm sleep disorder, unspecified type: Secondary | ICD-10-CM

## 2015-04-29 DIAGNOSIS — G4733 Obstructive sleep apnea (adult) (pediatric): Secondary | ICD-10-CM

## 2015-04-30 NOTE — Sleep Study (Signed)
Please see the scanned sleep study interpretation located in the Procedure tab within the Chart Review section. 

## 2015-05-01 ENCOUNTER — Telehealth: Payer: Self-pay

## 2015-05-01 NOTE — Telephone Encounter (Signed)
Pt called stating that his insurance faxed something over. Wanted to know about this.

## 2015-05-01 NOTE — Telephone Encounter (Signed)
He stated they sent it weeks ago but said he could have [place] confused with endocrinology

## 2015-05-01 NOTE — Telephone Encounter (Signed)
I haven't gotten anything on him today?

## 2015-05-02 ENCOUNTER — Telehealth: Payer: Self-pay | Admitting: Medical

## 2015-05-02 NOTE — Telephone Encounter (Signed)
Pt is not sure.

## 2015-05-02 NOTE — Telephone Encounter (Signed)
Received signed request from [employer]. Records faxed to [phone]

## 2015-05-02 NOTE — Telephone Encounter (Signed)
If he is talking about his work forms, endocrinology completed those

## 2015-05-04 ENCOUNTER — Telehealth: Payer: Self-pay | Admitting: Neurology

## 2015-05-04 NOTE — Telephone Encounter (Signed)
Patient referred from ER, seen by me on 04/05/15, diagnostic PSG on 04/29/15.   Please call and notify the patient that the recent sleep study did not show any significant obstructive sleep apnea. Please inform patient that I would like to go over the details of the study during a follow up appointment, also to recheck his tremor. Arrange a followup appointment. Also, route or fax report to PCP and referring MD, if other than PCP.  Once you have spoken to patient, you can close this encounter.   Thanks,  Huston Foley, MD, PhD [facility])

## 2015-05-07 ENCOUNTER — Encounter: Payer: Self-pay | Admitting: Endocrinology

## 2015-05-07 ENCOUNTER — Ambulatory Visit (INDEPENDENT_AMBULATORY_CARE_PROVIDER_SITE_OTHER): Payer: BLUE CROSS/BLUE SHIELD | Admitting: Endocrinology

## 2015-05-07 VITALS — BP 132/64 | HR 80 | Temp 97.8°F | Ht 65.0 in | Wt 189.0 lb

## 2015-05-07 DIAGNOSIS — E0590 Thyrotoxicosis, unspecified without thyrotoxic crisis or storm: Secondary | ICD-10-CM

## 2015-05-07 DIAGNOSIS — Z7689 Persons encountering health services in other specified circumstances: Secondary | ICD-10-CM

## 2015-05-07 LAB — T4, FREE: Free T4: 0.93 ng/dL (ref 0.60–1.60)

## 2015-05-07 LAB — TSH: TSH: 0.16 u[IU]/mL — AB (ref 0.35–4.50)

## 2015-05-07 MED ORDER — METOPROLOL SUCCINATE ER 25 MG PO TB24: 25.0000 mg | ORAL_TABLET | Freq: Every day | ORAL | Status: DC

## 2015-05-07 NOTE — Progress Notes (Signed)
Subjective:    Patient ID: Jonathan Mcmahon, male    DOB: July 07, 1970, 45 y.o.   MRN: [id number]  HPI Pt returns for f/u of hyperthyroidism (dx'ed early 2017; [place] was c/w Grave's Dz; he works as a Biomedical scientist; tapazole and inderal were chosen as initial rx, due to severity). pt states he feels well in general, except for weakness of the legs.  He says he never misses the synthroid.   Past Medical History  Diagnosis Date  . 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 Fatty liver   . Cyst of left kidney     Past Surgical History  Procedure Laterality Date  . Appendectomy      Social History   Social History  . Marital Status: Married    Spouse Name: N/A  . Number of Children: 3  . Years of Education: 12   Occupational History  . [employer] doctor     Social History Main Topics  . Smoking status: Never Smoker   . Smokeless tobacco: Not on file  . Alcohol Use: No  . Drug Use: No  . Sexual Activity: Not on file   Other Topics Concern  . Not on file   Social History Narrative   Walks for exercise some.  Recent changed eating habits to more healthy.  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     Drinks about a couple cups of tea a week     Current Outpatient Prescriptions on File Prior to Visit  Medication Sig Dispense Refill  . aspirin 81 MG chewable tablet Chew 1 tablet (81 mg total) by mouth daily. 30 tablet 3  . methimazole (TAPAZOLE) 10 MG tablet Take 4 tablets (40 mg total) by mouth 2 (two) times daily. (Patient taking differently: Take 20 mg by mouth 2 (two) times daily. ) 240 tablet 1  . triamcinolone cream (KENALOG) 0.1 % Apply 1 application topically 4 (four) times daily as needed. For rash 45 g 2   No current facility-administered medications on file prior to visit.    No Known Allergies  Family History  Problem Relation Age of Onset  . Renal cancer Mother   . CAD Father   . Heart disease Father 78    died of MI  .  Diabetes Father   . Gallbladder disease Neg Hx   . Thyroid disease Neg Hx     BP 132/64 mmHg  Pulse 80  Temp(Src) 97.8 F (36.6 C) (Oral)  Ht  (1.651 m)  Wt 189 lb (85.73 kg)  BMI 31.45 kg/m2  SpO2 95%    Review of Systems Denies fever    Objective:   Physical Exam VITAL SIGNS:  See vs page GENERAL: no distress NECK: thyroid is approx 4 times normal size, (R>L).  No palpable nodule   Lab Results  Component Value Date   TSH 0.16* 05/07/2015   T3TOTAL 521.5* 04/03/2015      Assessment & Plan:  Hyperthyroidism, improved  Patient is advised the following: Patient Instructions  blood tests are requested for you today.  We'll let you know about the results. i have sent a prescription to your pharmacy, for a pill similar to the propranolol, but milder.   Please come back for a follow-up appointment in 2 weeks.   Please plan to return to work in 05/23/15. We can consider the radioactive iodine pill when the thyroid levels are better.  addendum: reduce tapazole to 20-bid

## 2015-05-07 NOTE — Telephone Encounter (Signed)
I spoke to patient and he is aware of results and recommendations. He made f/u appt for this Wed. Had recent blood work done at PCP.

## 2015-05-07 NOTE — Patient Instructions (Addendum)
blood tests are requested for you today.  We'll let you know about the results. i have sent a prescription to your pharmacy, for a pill similar to the propranolol, but milder.   Please come back for a follow-up appointment in 2 weeks.   Please plan to return to work in 05/23/15. We can consider the radioactive iodine pill when the thyroid levels are better.

## 2015-05-09 ENCOUNTER — Ambulatory Visit: Payer: Self-pay | Admitting: Neurology

## 2015-05-09 ENCOUNTER — Telehealth: Payer: Self-pay

## 2015-05-09 NOTE — Telephone Encounter (Signed)
Patient did not show for appt today.                         

## 2015-05-10 ENCOUNTER — Encounter: Payer: Self-pay | Admitting: Neurology

## 2015-05-23 ENCOUNTER — Encounter: Payer: Self-pay | Admitting: Endocrinology

## 2015-05-23 ENCOUNTER — Ambulatory Visit (INDEPENDENT_AMBULATORY_CARE_PROVIDER_SITE_OTHER): Payer: BLUE CROSS/BLUE SHIELD | Admitting: Endocrinology

## 2015-05-23 VITALS — BP 122/84 | HR 95 | Temp 98.1°F | Ht 65.0 in | Wt 194.0 lb

## 2015-05-23 DIAGNOSIS — E0590 Thyrotoxicosis, unspecified without thyrotoxic crisis or storm: Secondary | ICD-10-CM | POA: Diagnosis not present

## 2015-05-23 LAB — T4, FREE: Free T4: 2.37 ng/dL — ABNORMAL HIGH (ref 0.60–1.60)

## 2015-05-23 LAB — TSH: TSH: 0.12 u[IU]/mL — ABNORMAL LOW (ref 0.35–4.50)

## 2015-05-23 MED ORDER — METHIMAZOLE 10 MG PO TABS: 20.0000 mg | ORAL_TABLET | Freq: Two times a day (BID) | ORAL | Status: DC

## 2015-05-23 MED ORDER — METHIMAZOLE 10 MG PO TABS: 40.0000 mg | ORAL_TABLET | Freq: Two times a day (BID) | ORAL | Status: DC

## 2015-05-23 NOTE — Patient Instructions (Addendum)
blood tests are requested for you today.  We'll let you know about the results.   Please come back for a follow-up appointment in 1 month.   We can consider the radioactive iodine pill when the thyroid levels are better.

## 2015-05-23 NOTE — Progress Notes (Signed)
   Subjective:    Patient ID: Jonathan Mcmahon, male    DOB: 12-11-1970, 45 y.o.   MRN: [id number]  HPI Pt returns for f/u of hyperthyroidism (dx'ed early 2017; [place] was c/w Grave's Dz; he works as a Biomedical scientist; tapazole and inderal were chosen as initial rx, due to severity). He has ongoing weakness of the legs and fatigue.  He says he never misses the tapazole.  Past Medical History  Diagnosis Date  . 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 Fatty liver   . Cyst of left kidney     Past Surgical History  Procedure Laterality Date  . Appendectomy      Social History   Social History  . Marital Status: Married    Spouse Name: N/A  . Number of Children: 3  . Years of Education: 12   Occupational History  . Truck [employer] doctor     Social History Main Topics  . Smoking status: Never Smoker   . Smokeless tobacco: Not on file  . Alcohol Use: No  . Drug Use: No  . Sexual Activity: Not on file   Other Topics Concern  . Not on file   Social History Narrative   Walks for exercise some.  Recent changed eating habits to more healthy.  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     Drinks about a couple cups of tea a week     Current Outpatient Prescriptions on File Prior to Visit  Medication Sig Dispense Refill  . aspirin 81 MG chewable tablet Chew 1 tablet (81 mg total) by mouth daily. 30 tablet 3  . metoprolol succinate (TOPROL-XL) 25 MG 24 hr tablet Take 1 tablet (25 mg total) by mouth daily. 30 tablet 1  . triamcinolone cream (KENALOG) 0.1 % Apply 1 application topically 4 (four) times daily as needed. For rash 45 g 2   No current facility-administered medications on file prior to visit.    No Known Allergies  Family History  Problem Relation Age of Onset  . Renal cancer Mother   . CAD Father   . Heart disease Father 36    died of MI  . Diabetes Father   . Gallbladder disease Neg Hx   . Thyroid disease Neg Hx     BP 122/84 mmHg   Pulse 95  Temp(Src) 98.1 F (36.7 C) (Oral)  Ht  (1.651 m)  Wt 194 lb (87.998 kg)  BMI 32.28 kg/m2  SpO2 95%  Review of Systems Denies fever and sob    Objective:   Physical Exam VITAL SIGNS:  See vs page GENERAL: no distress NECK: thyroid is approx 4 times normal size, (R>L).  No palpable nodule  Lab Results  Component Value Date   TSH 0.12* 05/23/2015   T3TOTAL 521.5* 04/03/2015       Assessment & Plan:  Hyperthyroidism: worse  Patient is advised the following: Patient Instructions  blood tests are requested for you today.  We'll let you know about the results.   Please come back for a follow-up appointment in 1 month.   We can consider the radioactive iodine pill when the thyroid levels are better.    addendum: increase the thyroid pill back to 4 pills, twice a day.

## 2015-05-28 ENCOUNTER — Telehealth: Payer: Self-pay | Admitting: Endocrinology

## 2015-05-28 NOTE — Telephone Encounter (Signed)
Do you feel as though you are ready to go back?

## 2015-05-28 NOTE — Telephone Encounter (Signed)
I contacted the pt he wanted to think about this and get back with [place] tomorrow.

## 2015-05-28 NOTE — Telephone Encounter (Signed)
I contacted the pt and advised of recent blood test. Pt wanted to know based off the recent blood test if he is cleared to go back to work. Pt stated if he is cleared he would need a doctors note stating it is ok for him to drive without any restrictions. Please advise, Thanks!

## 2015-05-28 NOTE — Telephone Encounter (Signed)
Pt called in requesting his lab results, he said that would determine if he could go back to work or not?

## 2015-05-29 ENCOUNTER — Telehealth: Payer: Self-pay | Admitting: Endocrinology

## 2015-05-29 NOTE — Telephone Encounter (Signed)
Pt returning your call about returning to work

## 2015-05-30 NOTE — Telephone Encounter (Signed)
Left a voicemail requesting a call back from the pt to discuss.  

## 2015-05-31 ENCOUNTER — Telehealth: Payer: Self-pay | Admitting: Endocrinology

## 2015-05-31 NOTE — Telephone Encounter (Signed)
Patient would like to have Megan call him   Thank you

## 2015-05-31 NOTE — Telephone Encounter (Signed)
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 Pt stated his short term disability agency will be sending paper work for [place] to complete.

## 2015-06-03 ENCOUNTER — Emergency Department (HOSPITAL_COMMUNITY): Payer: BLUE CROSS/BLUE SHIELD

## 2015-06-03 ENCOUNTER — Emergency Department (HOSPITAL_COMMUNITY)
Admission: EM | Admit: 2015-06-03 | Discharge: 2015-06-03 | Disposition: A | Discharge status: Home / Self Care | Payer: BLUE CROSS/BLUE SHIELD | Attending: Emergency Medicine | Admitting: Emergency Medicine

## 2015-06-03 ENCOUNTER — Encounter (HOSPITAL_COMMUNITY): Payer: Self-pay | Admitting: *Deleted

## 2015-06-03 DIAGNOSIS — Z79899 Other long term (current) drug therapy: Secondary | ICD-10-CM | POA: Diagnosis not present

## 2015-06-03 DIAGNOSIS — Z8719 Personal history of other diseases of the digestive system: Secondary | ICD-10-CM | POA: Insufficient documentation

## 2015-06-03 DIAGNOSIS — Z8739 Personal history of other diseases of the musculoskeletal system and connective tissue: Secondary | ICD-10-CM | POA: Diagnosis not present

## 2015-06-03 DIAGNOSIS — Z7982 Long term (current) use of aspirin: Secondary | ICD-10-CM | POA: Insufficient documentation

## 2015-06-03 DIAGNOSIS — R109 Unspecified abdominal pain: Secondary | ICD-10-CM | POA: Diagnosis present

## 2015-06-03 DIAGNOSIS — Q6100 Congenital renal cyst, unspecified: Secondary | ICD-10-CM | POA: Insufficient documentation

## 2015-06-03 DIAGNOSIS — N200 Calculus of kidney: Secondary | ICD-10-CM

## 2015-06-03 HISTORY — DX: Calculus of kidney: N20.0

## 2015-06-03 LAB — URINALYSIS, ROUTINE W REFLEX MICROSCOPIC
Bilirubin Urine: NEGATIVE
Glucose, UA: NEGATIVE mg/dL
KETONES UR: NEGATIVE mg/dL
LEUKOCYTES UA: NEGATIVE
NITRITE: NEGATIVE
PH: 5.5 (ref 5.0–8.0)
Protein, ur: NEGATIVE mg/dL
SPECIFIC GRAVITY, URINE: 1.017 (ref 1.005–1.030)

## 2015-06-03 LAB — CBC
HCT: 41.5 % (ref 39.0–52.0)
Hemoglobin: 13.9 g/dL (ref 13.0–17.0)
MCH: 29.3 pg (ref 26.0–34.0)
MCHC: 33.5 g/dL (ref 30.0–36.0)
MCV: 87.4 fL (ref 78.0–100.0)
PLATELETS: 254 10*3/uL (ref 150–400)
RBC: 4.75 MIL/uL (ref 4.22–5.81)
RDW: 12.9 % (ref 11.5–15.5)
WBC: 12.8 10*3/uL — AB (ref 4.0–10.5)

## 2015-06-03 LAB — COMPREHENSIVE METABOLIC PANEL
ALK PHOS: 94 U/L (ref 38–126)
ALT: 45 U/L (ref 17–63)
AST: 27 U/L (ref 15–41)
Albumin: 4.2 g/dL (ref 3.5–5.0)
Anion gap: 11 (ref 5–15)
BILIRUBIN TOTAL: 1 mg/dL (ref 0.3–1.2)
BUN: 15 mg/dL (ref 6–20)
CALCIUM: 10.2 mg/dL (ref 8.9–10.3)
CHLORIDE: 105 mmol/L (ref 101–111)
CO2: 24 mmol/L (ref 22–32)
CREATININE: 0.75 mg/dL (ref 0.61–1.24)
GFR calc Af Amer: >60 mL/min (ref >60)
Glucose, Bld: 126 mg/dL — ABNORMAL HIGH (ref 65–99)
Potassium: 4.2 mmol/L (ref 3.5–5.1)
Sodium: 140 mmol/L (ref 135–145)
Total Protein: 8 g/dL (ref 6.5–8.1)

## 2015-06-03 LAB — URINE MICROSCOPIC-ADD ON

## 2015-06-03 MED ORDER — HYDROMORPHONE HCL 1 MG/ML IJ SOLN
1.0000 mg | Freq: Once | INTRAMUSCULAR | Status: AC
  Administered 2015-06-03: 1 mg via INTRAVENOUS
  Filled 2015-06-03: qty 1

## 2015-06-03 MED ORDER — KETOROLAC TROMETHAMINE 30 MG/ML IJ SOLN
30.0000 mg | Freq: Once | INTRAMUSCULAR | Status: AC
  Administered 2015-06-03: 30 mg via INTRAVENOUS
  Filled 2015-06-03: qty 1

## 2015-06-03 MED ORDER — ONDANSETRON HCL 4 MG/2ML IJ SOLN
4.0000 mg | Freq: Once | INTRAMUSCULAR | Status: AC
  Administered 2015-06-03: 4 mg via INTRAVENOUS
  Filled 2015-06-03: qty 2

## 2015-06-03 MED ORDER — ONDANSETRON 4 MG PO TBDP: 4.0000 mg | ORAL_TABLET | ORAL | Status: DC | PRN

## 2015-06-03 MED ORDER — OXYCODONE-ACETAMINOPHEN 5-325 MG PO TABS: 2.0000 | ORAL_TABLET | ORAL | Status: DC | PRN

## 2015-06-03 NOTE — ED Notes (Signed)
MD at bedside. 

## 2015-06-03 NOTE — Discharge Instructions (Signed)
Kidney Stones °Kidney stones (urolithiasis) are deposits that form inside your kidneys. The intense pain is caused by the stone moving through the urinary tract. When the stone moves, the ureter goes into spasm around the stone. The stone is usually passed in the urine.  °CAUSES  °· A disorder that makes certain neck glands produce too much parathyroid hormone (primary hyperparathyroidism). °· A buildup of uric acid crystals, similar to gout in your joints. °· Narrowing (stricture) of the ureter. °· A kidney obstruction present at birth (congenital obstruction). °· Previous surgery on the kidney or ureters. °· Numerous kidney infections. °SYMPTOMS  °· Feeling sick to your stomach (nauseous). °· Throwing up (vomiting). °· Blood in the urine (hematuria). °· Pain that usually spreads (radiates) to the groin. °· Frequency or urgency of urination. °DIAGNOSIS  °· Taking a history and physical exam. °· Blood or urine tests. °· CT scan. °· Occasionally, an examination of the inside of the urinary bladder (cystoscopy) is performed. °TREATMENT  °· Observation. °· Increasing your fluid intake. °· Extracorporeal shock wave lithotripsy--This is a noninvasive procedure that uses shock waves to break up kidney stones. °· Surgery may be needed if you have severe pain or persistent obstruction. There are various surgical procedures. Most of the procedures are performed with the use of small instruments. Only small incisions are needed to accommodate these instruments, so recovery time is minimized. °The size, location, and chemical composition are all important variables that will determine the proper choice of action for you. Talk to your health care provider to better understand your situation so that you will minimize the risk of injury to yourself and your kidney.  °HOME CARE INSTRUCTIONS  °· Drink enough water and fluids to keep your urine clear or pale yellow. This will help you to pass the stone or stone fragments. °· Strain  all urine through the provided strainer. Keep all particulate matter and stones for your health care provider to see. The stone causing the pain may be as small as a grain of salt. It is very important to use the strainer each and every time you pass your urine. The collection of your stone will allow your health care provider to analyze it and verify that a stone has actually passed. The stone analysis will often identify what you can do to reduce the incidence of recurrences. °· Only take over-the-counter or prescription medicines for pain, discomfort, or fever as directed by your health care provider. °· Keep all follow-up visits as told by your health care provider. This is important. °· Get follow-up X-rays if required. The absence of pain does not always mean that the stone has passed. It may have only stopped moving. If the urine remains completely obstructed, it can cause loss of kidney function or even complete destruction of the kidney. It is your responsibility to make sure X-rays and follow-ups are completed. Ultrasounds of the kidney can show blockages and the status of the kidney. Ultrasounds are not associated with any radiation and can be performed easily in a matter of minutes. °· Make changes to your daily diet as told by your health care provider. You may be told to: °¨ Limit the amount of salt that you eat. °¨ Eat 5 or more servings of fruits and vegetables each day. °¨ Limit the amount of meat, poultry, fish, and eggs that you eat. °· Collect a 24-hour urine sample as told by your health care provider. You may need to collect another urine sample every 6-12   months. °SEEK MEDICAL CARE IF: °· You experience pain that is progressive and unresponsive to any pain medicine you have been prescribed. °SEEK IMMEDIATE MEDICAL CARE IF:  °· Pain cannot be controlled with the prescribed medicine. °· You have a fever or shaking chills. °· The severity or intensity of pain increases over 18 hours and is not  relieved by pain medicine. °· You develop a new onset of abdominal pain. °· You feel faint or pass out. °· You are unable to urinate. °  °This information is not intended to replace advice given to you by your health care provider. Make sure you discuss any questions you have with your health care provider. °  °Document Released: 03/17/2005 Document Revised: 12/06/2014 Document Reviewed: 08/18/2012 °Elsevier Interactive Patient Education ©2016 [employer] ° °

## 2015-06-03 NOTE — ED Notes (Signed)
PT arrives via EMS to triage. Pt comes from home, reports c/o back pain and vomiting, feels similar to when he had a kidney stone several years ago.

## 2015-06-03 NOTE — ED Notes (Signed)
Pt reports right sided flank pain that awoke him from sleep around 2am this morning, reports vomiting - dry heaves in triage.

## 2015-06-03 NOTE — ED Provider Notes (Signed)
CSN: [id number]     Arrival date & time 06/03/15  0407 History   First MD Initiated Contact with Patient 06/03/15 [phone]     Chief Complaint  Patient presents with  . Flank Pain     (Consider location/radiation/quality/duration/timing/severity/associated sxs/prior Treatment) HPI Patient has right-sided flank pain that awoke him from sleep this morning. He reports that he has dry heaves. He states that he has had no fever. He reports several years ago he had a kidney stone and it feels similar. He reports there was no comfortable position for him. Past Medical History  Diagnosis Date  . 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 Fatty liver   . Cyst of left kidney   . Kidney stone    Past Surgical History  Procedure Laterality Date  . Appendectomy     Family History  Problem Relation Age of Onset  . Renal cancer Mother   . CAD Father   . Heart disease Father 68    died of MI  . Diabetes Father   . Gallbladder disease Neg Hx   . Thyroid disease Neg Hx    Social History  Substance Use Topics  . Smoking status: Never Smoker   . Smokeless tobacco: None  . Alcohol Use: No    Review of Systems 10 Systems reviewed and are negative for acute change except as noted in the HPI.    Allergies  Review of patient's allergies indicates no known allergies.  Home Medications   Prior to Admission medications   Medication Sig Start Date End Date Taking? Authorizing Provider  aspirin 81 MG chewable tablet Chew 1 tablet (81 mg total) by mouth daily. 04/05/15   Kermit Balo Tysinger, PA-C  methimazole (TAPAZOLE) 10 MG tablet Take 4 tablets (40 mg total) by mouth 2 (two) times daily. 05/23/15   Romero Belling, MD  metoprolol succinate (TOPROL-XL) 25 MG 24 hr tablet Take 1 tablet (25 mg total) by mouth daily. 05/07/15   Romero Belling, MD  ondansetron (ZOFRAN ODT) 4 MG disintegrating tablet Take 1 tablet (4 mg total) by mouth every 4 (four) hours as needed for nausea or vomiting. 06/03/15   Arby Barrette, MD  oxyCODONE-acetaminophen (PERCOCET) 5-325 MG tablet Take 2 tablets by mouth every 4 (four) hours as needed. 06/03/15   Arby Barrette, MD  triamcinolone cream (KENALOG) 0.1 % Apply 1 application topically 4 (four) times daily as needed. For rash 04/20/15   Romero Belling, MD   BP 155/91 mmHg  Pulse 102  Temp(Src) 98.7 F (37.1 C) (Oral)  Resp 22  Ht  (1.626 m)  Wt 188 lb (85.276 kg)  BMI 32.25 kg/m2  SpO2 98% Physical Exam  Constitutional: He is oriented to person, place, and time. He appears well-developed and well-nourished.  Patient is moderately uncomfortable. He is appropriate. No respiratory distress. Nontoxic.  HENT:  Head: Normocephalic and atraumatic.  Eyes: EOM are normal. Pupils are equal, round, and reactive to light.  Neck: Neck supple.  Cardiovascular: Normal rate, regular rhythm, normal heart sounds and intact distal pulses.   Pulmonary/Chest: Effort normal and breath sounds normal.  Abdominal: Soft. Bowel sounds are normal. He exhibits no distension. There is no tenderness.  Musculoskeletal: Normal range of motion. He exhibits no edema.  Neurological: He is alert and oriented to person, place, and time. He has normal strength. Coordination normal. GCS eye subscore is 4. GCS verbal subscore is 5. GCS motor subscore is 6.  Skin: Skin is warm, dry and intact.  Psychiatric: He has a normal mood and affect.    ED Course  Procedures (including critical care time) Labs Review Labs Reviewed  URINALYSIS, ROUTINE W REFLEX MICROSCOPIC (NOT AT [facility]) - Abnormal; Notable for the following:    APPearance CLOUDY (*)    Hgb urine dipstick LARGE (*)    All other components within normal limits  URINE MICROSCOPIC-ADD ON - Abnormal; Notable for the following:    Squamous Epithelial / LPF 0-5 (*)    Bacteria, UA RARE (*)    All other components within normal limits  COMPREHENSIVE METABOLIC PANEL - Abnormal; Notable for the following:    Glucose, Bld 126 (*)    All  other components within normal limits  CBC - Abnormal; Notable for the following:    WBC 12.8 (*)    All other components within normal limits    Imaging Review Ct Renal Stone Study  06/03/2015  CLINICAL DATA:  Acute onset of right flank pain and vomiting. Initial encounter. EXAM: CT ABDOMEN AND PELVIS WITHOUT CONTRAST TECHNIQUE: Multidetector CT imaging of the abdomen and pelvis was performed following the standard protocol without IV contrast. COMPARISON:  MRI of the liver performed 08/15/2014, and abdominal ultrasound performed 08/10/2014. CT of the abdomen and pelvis performed 01/22/2008 FINDINGS: The visualized lung bases are clear. The liver and spleen are unremarkable in appearance. The gallbladder is within normal limits. The pancreas and adrenal glands are unremarkable. Minimal right-sided hydronephrosis is noted, with right-sided perinephric stranding and fluid, and an obstructing 3 mm stone noted at the right side of the base of the bladder, along the right vesicoureteral junction. A 1.9 cm cyst is noted at the lower pole of the left kidney, mildly increased in size from 2009. No nonobstructing renal stones are identified. No free fluid is identified. The small bowel is unremarkable in appearance. The stomach is within normal limits. No acute vascular abnormalities are seen. The patient is status post appendectomy. The colon is unremarkable in appearance. The bladder is mildly distended and otherwise unremarkable. The prostate is unremarkable in appearance. No inguinal lymphadenopathy is seen. No acute osseous abnormalities are identified. Multilevel vacuum phenomenon is noted at the lower lumbar spine. IMPRESSION: 1. Minimal right-sided hydronephrosis, with an obstructing 3 mm stone noted at the right side of the base of the bladder, along the right vesicoureteral junction. 2. Small left renal cyst noted. 3. Mild degenerative change at the lower lumbar spine. Electronically Signed   By: Roanna RaiderJeffery   Chang M.D.   On: 06/03/2015 06:50   I have personally reviewed and evaluated these images and lab results as part of my medical decision-making.   EKG Interpretation None      MDM   Final diagnoses:  Kidney stone   Patient has a 3 mm right-sided stone. Minimal hydronephrosis. At this time patient is stable for discharge with pain medication and follow up with urology. He'll be given a urine strainer.    Arby BarretteMarcy Tomeika Weinmann, MD 06/03/15 [phone]

## 2015-06-03 NOTE — ED Notes (Signed)
Patient transported to CT 

## 2015-06-03 NOTE — ED Notes (Signed)
Patient is alert and oriented x3.  He was given DC instructions and follow up visit instructions.  Patient gave verbal understanding.  He was DC ambulatory under his own power to home.  V/S stable.  He was not showing any signs of distress on DC 

## 2015-06-08 ENCOUNTER — Telehealth: Payer: Self-pay | Admitting: Endocrinology

## 2015-06-08 NOTE — Telephone Encounter (Signed)
Pt wants to know if you received the information from his disability to send back to them to extend his short term disability to when he comes back to see Dr. Everardo AllEllison

## 2015-06-08 NOTE — Telephone Encounter (Signed)
Pt contacted and advised we have not received any information from his employer about his disability. Pt voiced understanding.

## 2015-06-12 ENCOUNTER — Telehealth: Payer: Self-pay | Admitting: Endocrinology

## 2015-06-12 NOTE — Telephone Encounter (Signed)
i have, but this info request needs to be forwarded to med records

## 2015-06-12 NOTE — Telephone Encounter (Signed)
Pt calling regarding the disability paperwork-let pt know we have not received it and gave him the fax number for Asher MuirJamie to send to us

## 2015-06-12 NOTE — Telephone Encounter (Signed)
FYI  Please see below

## 2015-06-15 NOTE — Telephone Encounter (Signed)
Was forwarded to med records.

## 2015-06-15 NOTE — Telephone Encounter (Signed)
Pt stated this is a new disability request.

## 2015-06-17 NOTE — Progress Notes (Signed)
Subjective:    Patient ID: Jonathan Mcmahon, male    DOB: [birth year], 45 y.o.   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  weakness of the legs and fatigue are much better.  He says he never misses the tapazole.  Past Medical History  Diagnosis Date  . 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 Fatty liver   . Cyst of left kidney   . Kidney stone     Past Surgical History  Procedure Laterality Date  . Appendectomy      Social History   Social History  . Marital Status: Married    Spouse Name: N/A  . Number of Children: 3  . Years of Education: 12   Occupational History  . Truck Hospital doctorDriver     Social History Main Topics  . Smoking status: Never Smoker   . Smokeless tobacco: Not on file  . Alcohol Use: No  . Drug Use: No  . Sexual Activity: Not on file   Other Topics Concern  . Not on file   Social History Narrative   Walks for exercise some.  Recent changed eating habits to more healthy.  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     Drinks about a couple cups of tea a week     Current Outpatient Prescriptions on File Prior to Visit  Medication Sig Dispense Refill  . aspirin 81 MG chewable tablet Chew 1 tablet (81 mg total) by mouth daily. 30 tablet 3  . metoprolol succinate (TOPROL-XL) 25 MG 24 hr tablet Take 1 tablet (25 mg total) by mouth daily. 30 tablet 1  . ondansetron (ZOFRAN ODT) 4 MG disintegrating tablet Take 1 tablet (4 mg total) by mouth every 4 (four) hours as needed for nausea or vomiting. 20 tablet 0  . oxyCODONE-acetaminophen (PERCOCET) 5-325 MG tablet Take 2 tablets by mouth every 4 (four) hours as needed. 20 tablet 0  . triamcinolone cream (KENALOG) 0.1 % Apply 1 application topically 4 (four) times daily as needed. For rash 45 g 2   No current  facility-administered medications on file prior to visit.    No Known Allergies  Family History  Problem Relation Age of Onset  . Renal cancer Mother   . CAD Father   . Heart disease Father 657    died of MI  . Diabetes Father   . Gallbladder disease Neg Hx   . Thyroid disease Neg Hx     BP 134/62 mmHg  Pulse 81  Temp(Src) 97.9 F (36.6 C) (Oral)  Ht 5\' 5"  (1.651 m)  Wt 189 lb (85.73 kg)  BMI 31.45 kg/m2  SpO2 97%  Review of Systems Denies fever    Objective:   Physical Exam VITAL SIGNS:  See vs page GENERAL: no distress. NECK: thyroid is approx 4 times normal size, (R>L).  No palpable nodule.   Gait: normal and steady.    Lab Results  Component Value Date   TSH 0.07* 06/18/2015   T3TOTAL 521.5* 04/03/2015      Assessment & Plan:  Hyperthyroidism, improved.    Patient is advised the following: Patient Instructions  blood tests are requested for you today.  We'll let you know about the results.   If the results are better,  we'll do the radioactive iodine pill.  Here is how it works: We would first check a thyroid "scan" (a special, but easy and painless type of thyroid x ray test).  you go to the x-ray department of the hospital to swallow a pill, which contains a miniscule amount of radiation.  You will not notice any symptoms from this.  You will go back to the x-ray department the next day, to lie down in front of a camera.  The results of this will be sent to me.   Based on the results, i hope to order for you a treatment pill of radioactive iodine.  Although it is a larger amount of radiation, you will again notice no symptoms from this.  The pill is gone from your body in a few days (during which you should stay away from other people), but takes several months to work.  Therefore, please return here approximately 6-8 weeks after the treatment.  This treatment has been available for many years, and the only known side-effect is an underactive thyroid.  It is  possible that i would eventually prescribe for you a thyroid hormone pill, which is very inexpensive.  You don't have to worry about side-effects of this thyroid hormone pill, because it is the same molecule your thyroid makes.  addendum: d/c tapazole.  I ordered scan.

## 2015-06-18 ENCOUNTER — Ambulatory Visit (INDEPENDENT_AMBULATORY_CARE_PROVIDER_SITE_OTHER): Payer: BLUE CROSS/BLUE SHIELD | Admitting: Endocrinology

## 2015-06-18 ENCOUNTER — Encounter: Payer: Self-pay | Admitting: Endocrinology

## 2015-06-18 VITALS — BP 134/62 | HR 81 | Temp 97.9°F | Ht 65.0 in | Wt 189.0 lb

## 2015-06-18 DIAGNOSIS — E0590 Thyrotoxicosis, unspecified without thyrotoxic crisis or storm: Secondary | ICD-10-CM

## 2015-06-18 LAB — T4, FREE: Free T4: 0.51 ng/dL — ABNORMAL LOW (ref 0.60–1.60)

## 2015-06-18 LAB — TSH: TSH: 0.07 u[IU]/mL — AB (ref 0.35–4.50)

## 2015-06-18 NOTE — Patient Instructions (Addendum)
blood tests are requested for you today.  We'll let you know about the results.   If the results are better, we'll do the radioactive iodine pill.  Here is how it works: We would first check a thyroid "scan" (a special, but easy and painless type of thyroid x ray test).  you go to the x-ray department of the hospital to swallow a pill, which contains a miniscule amount of radiation.  You will not notice any symptoms from this.  You will go back to the x-ray department the next day, to lie down in front of a camera.  The results of this will be sent to me.   Based on the results, i hope to order for you a treatment pill of radioactive iodine.  Although it is a larger amount of radiation, you will again notice no symptoms from this.  The pill is gone from your body in a few days (during which you should stay away from other people), but takes several months to work.  Therefore, please return here approximately 6-8 weeks after the treatment.  This treatment has been available for many years, and the only known side-effect is an underactive thyroid.  It is possible that i would eventually prescribe for you a thyroid hormone pill, which is very inexpensive.  You don't have to worry about side-effects of this thyroid hormone pill, because it is the same molecule your thyroid makes.

## 2015-07-02 ENCOUNTER — Ambulatory Visit: Payer: BLUE CROSS/BLUE SHIELD | Admitting: Neurology

## 2015-07-02 ENCOUNTER — Ambulatory Visit (HOSPITAL_COMMUNITY): Payer: BLUE CROSS/BLUE SHIELD

## 2015-07-03 ENCOUNTER — Encounter (HOSPITAL_COMMUNITY): Payer: BLUE CROSS/BLUE SHIELD

## 2015-07-16 ENCOUNTER — Encounter (HOSPITAL_COMMUNITY): Payer: BLUE CROSS/BLUE SHIELD

## 2015-07-16 ENCOUNTER — Encounter (HOSPITAL_COMMUNITY)
Admission: RE | Admit: 2015-07-16 | Discharge: 2015-07-16 | Disposition: A | Discharge status: Home / Self Care | Payer: BLUE CROSS/BLUE SHIELD | Source: Ambulatory Visit | Attending: Endocrinology | Admitting: Endocrinology

## 2015-07-16 DIAGNOSIS — E0590 Thyrotoxicosis, unspecified without thyrotoxic crisis or storm: Secondary | ICD-10-CM | POA: Insufficient documentation

## 2015-07-16 MED ORDER — SODIUM IODIDE I 131 CAPSULE
7.9000 | Freq: Once | INTRAVENOUS | Status: AC | PRN
  Administered 2015-07-16: 7.9 via ORAL

## 2015-07-17 ENCOUNTER — Other Ambulatory Visit: Payer: Self-pay | Admitting: Endocrinology

## 2015-07-17 ENCOUNTER — Encounter (HOSPITAL_COMMUNITY)
Admission: RE | Admit: 2015-07-17 | Discharge: 2015-07-17 | Disposition: A | Discharge status: Home / Self Care | Payer: BLUE CROSS/BLUE SHIELD | Source: Ambulatory Visit | Attending: Endocrinology | Admitting: Endocrinology

## 2015-07-17 ENCOUNTER — Encounter (HOSPITAL_COMMUNITY): Payer: BLUE CROSS/BLUE SHIELD

## 2015-07-17 DIAGNOSIS — E0590 Thyrotoxicosis, unspecified without thyrotoxic crisis or storm: Secondary | ICD-10-CM

## 2015-07-17 MED ORDER — SODIUM PERTECHNETATE TC 99M INJECTION
10.0000 | Freq: Once | INTRAVENOUS | Status: AC | PRN
  Administered 2015-07-17: 9.75 via INTRAVENOUS

## 2015-07-26 ENCOUNTER — Encounter (HOSPITAL_COMMUNITY)
Admission: RE | Admit: 2015-07-26 | Discharge: 2015-07-26 | Disposition: A | Discharge status: Home / Self Care | Payer: BLUE CROSS/BLUE SHIELD | Source: Ambulatory Visit | Attending: Endocrinology | Admitting: Endocrinology

## 2015-07-26 DIAGNOSIS — E0590 Thyrotoxicosis, unspecified without thyrotoxic crisis or storm: Secondary | ICD-10-CM | POA: Diagnosis not present

## 2015-07-26 MED ORDER — SODIUM IODIDE I 131 CAPSULE
20.8000 | Freq: Once | INTRAVENOUS | Status: AC | PRN
  Administered 2015-07-26: 20.8 via ORAL

## 2015-07-30 ENCOUNTER — Telehealth: Payer: Self-pay | Admitting: Endocrinology

## 2015-07-30 MED ORDER — METHIMAZOLE 10 MG PO TABS: 40.0000 mg | ORAL_TABLET | Freq: Two times a day (BID) | ORAL | Status: DC

## 2015-07-30 NOTE — Telephone Encounter (Signed)
PT wanted to know if he needs to get back on the Methimazole or not.  [phone]

## 2015-07-30 NOTE — Telephone Encounter (Signed)
Yes, please resume i have sent a prescription to your pharmacy Please come back for a follow-up appointment in 1 month.

## 2015-07-30 NOTE — Telephone Encounter (Signed)
Pt notified. Pt verbalized understanding. Pt to call back to schedule appointment.

## 2015-07-30 NOTE — Telephone Encounter (Signed)
Please read message below and advise.  

## 2015-10-22 ENCOUNTER — Ambulatory Visit (INDEPENDENT_AMBULATORY_CARE_PROVIDER_SITE_OTHER): Payer: BLUE CROSS/BLUE SHIELD | Admitting: Endocrinology

## 2015-10-22 ENCOUNTER — Encounter: Payer: Self-pay | Admitting: Endocrinology

## 2015-10-22 VITALS — BP 146/77 | HR 75 | Temp 98.6°F | Ht 65.0 in | Wt 196.4 lb

## 2015-10-22 DIAGNOSIS — E0590 Thyrotoxicosis, unspecified without thyrotoxic crisis or storm: Secondary | ICD-10-CM | POA: Diagnosis not present

## 2015-10-22 LAB — TSH: TSH: 3.99 u[IU]/mL (ref 0.35–4.50)

## 2015-10-22 NOTE — Progress Notes (Signed)
   Subjective:    Patient ID: Jonathan Mcmahon, male    DOB: May 27, 1970, 45 y.o.   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  weakness of the legs and fatigue are much better.  He says he continued the tapazole until 1 month ago, but stopped, due to palpitations.  Past Medical History:  Diagnosis Date  . Cyst of left kidney   . Disc disorder    "MRI bulging disc L4 and L5"  . Fatty liver   . Kidney stone     Past Surgical History:  Procedure Laterality Date  . APPENDECTOMY      Social History   Social History  . Marital status: Married    Spouse name: N/A  . Number of children: 3  . Years of education: 12   Occupational History  . [employer] doctor     Social History Main Topics  . Smoking status: Never Smoker  . Smokeless tobacco: Not on file  . Alcohol use No  . Drug use: No  . Sexual activity: Not on file   Other Topics Concern  . Not on file   Social History Narrative   Walks for exercise some.  Recent changed eating habits to more healthy.  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     Drinks about a couple cups of tea a week     Current Outpatient Prescriptions on File Prior to Visit  Medication Sig Dispense Refill  . aspirin 81 MG chewable tablet Chew 1 tablet (81 mg total) by mouth daily. 30 tablet 3  . triamcinolone cream (KENALOG) 0.1 % Apply 1 application topically 4 (four) times daily as needed. For rash 45 g 2   No current facility-administered medications on file prior to visit.     No Known Allergies  Family History  Problem Relation Age of Onset  . Renal cancer Mother   . CAD Father   . Heart disease Father 51    died of MI  . Diabetes Father   . Gallbladder disease Neg Hx   . Thyroid disease Neg Hx     BP (!) 146/77   Pulse 75   Temp 98.6 F (37 C) (Oral)    Ht 5\' 5"  (1.651 m)   Wt 196 lb 6.4 oz (89.1 kg)   SpO2 96%   BMI 32.68 kg/m    Review of Systems Denies fever.  He has gained weight    Objective:   Physical Exam VITAL SIGNS:  See vs page GENERAL: no distress NECK: There is no palpable thyroid enlargement.  No thyroid nodule is palpable.  No palpable lymphadenopathy at the anterior neck.   Lab Results  Component Value Date   TSH 3.99 10/22/2015   T3TOTAL 521.5 (H) 04/03/2015  free T4=low    Assessment & Plan:  Post-RAI hypothyroidism, new.  I rx'ed synthroid.   Patient is advised the following: Patient Instructions  blood tests are requested for you today.  We'll let you know about the results. Based on the results, you may need to take a thyroid hormone pill.   Please come back for a follow-up appointment in 2 months.   Romero Belling, MD

## 2015-10-22 NOTE — Patient Instructions (Addendum)
blood tests are requested for you today.  We'll let you know about the results. Based on the results, you may need to take a thyroid hormone pill.   Please come back for a follow-up appointment in 2 months.

## 2015-10-23 LAB — T4, FREE: Free T4: 0.48 ng/dL — ABNORMAL LOW (ref 0.60–1.60)

## 2015-10-23 MED ORDER — LEVOTHYROXINE SODIUM 100 MCG PO TABS: 100.0000 ug | ORAL_TABLET | Freq: Every day | ORAL | 4 refills | Status: DC

## 2015-12-24 ENCOUNTER — Ambulatory Visit: Payer: Managed Care, Other (non HMO) | Admitting: Endocrinology

## 2015-12-24 DIAGNOSIS — Z0289 Encounter for other administrative examinations: Secondary | ICD-10-CM

## 2016-03-10 ENCOUNTER — Encounter: Payer: Self-pay | Admitting: Medical

## 2016-03-10 ENCOUNTER — Ambulatory Visit (INDEPENDENT_AMBULATORY_CARE_PROVIDER_SITE_OTHER): Payer: Managed Care, Other (non HMO) | Admitting: Medical

## 2016-03-10 VITALS — BP 130/70 | HR 75 | Temp 98.2°F | Wt 203.0 lb

## 2016-03-10 DIAGNOSIS — E039 Hypothyroidism, unspecified: Secondary | ICD-10-CM | POA: Diagnosis not present

## 2016-03-10 DIAGNOSIS — Z113 Encounter for screening for infections with a predominantly sexual mode of transmission: Secondary | ICD-10-CM

## 2016-03-10 DIAGNOSIS — N529 Male erectile dysfunction, unspecified: Secondary | ICD-10-CM

## 2016-03-10 DIAGNOSIS — R350 Frequency of micturition: Secondary | ICD-10-CM | POA: Diagnosis not present

## 2016-03-10 DIAGNOSIS — R002 Palpitations: Secondary | ICD-10-CM | POA: Diagnosis not present

## 2016-03-10 DIAGNOSIS — R7301 Impaired fasting glucose: Secondary | ICD-10-CM

## 2016-03-10 DIAGNOSIS — R10819 Abdominal tenderness, unspecified site: Secondary | ICD-10-CM

## 2016-03-10 LAB — POCT URINALYSIS DIPSTICK
Bilirubin, UA: NEGATIVE
Blood, UA: NEGATIVE
Glucose, UA: NEGATIVE
Ketones, UA: NEGATIVE
LEUKOCYTES UA: NEGATIVE
NITRITE UA: NEGATIVE
PROTEIN UA: NEGATIVE
Spec Grav, UA: 1.02
UROBILINOGEN UA: NEGATIVE
pH, UA: 6

## 2016-03-10 LAB — POCT GLYCOSYLATED HEMOGLOBIN (HGB A1C): HEMOGLOBIN A1C: 5.6

## 2016-03-10 MED ORDER — CIPROFLOXACIN HCL 500 MG PO TABS: 500.0000 mg | ORAL_TABLET | Freq: Two times a day (BID) | ORAL | 0 refills | Status: DC

## 2016-03-10 MED ORDER — LEVOTHYROXINE SODIUM 100 MCG PO TABS: 100.0000 ug | ORAL_TABLET | Freq: Every day | ORAL | 1 refills | Status: DC

## 2016-03-10 NOTE — Progress Notes (Signed)
Subjective: Chief Complaint  Patient presents with  . possible  uti    lower  back pain, possible uti   Here for urinary symptoms.   Accompanied by his wife.  Here for urinary c/o.  He notes 2-3 week history of dark yellow brown urine with odor.  No blood in urine.  There is some lower abdominal discomfort with urination, sometimes urgency.   someday's has some urinary urgency.  No penile discharge.  No fever.   Has chronic back pain, but no new changes in that.  Had UTI years ago in his 2620s.   Has hx/o enlarged prostate.  No hx/o prostatitis.   Drinks a lot of water.  In general gets up about twice per night to urinate.   Urine stream doesn't seem to have changes.    He does have hx/o renal stone from March, does report that he passed the stone.  No urinary procedure was done by urology to remove a stone.  Has hx/o renal stones.  He notes problems with erections, not every time he wants to have intercourse but often in recent months.  Has problems getting and keeping erections.     He notes prior eval with cardiology within the past year, Dr. Isabel CapriceVaranassi although we have no records to review.      Past Medical History:  Diagnosis Date  . Cyst of left kidney   . Disc disorder    "MRI bulging disc L4 and L5"  . Fatty liver   . Kidney stone    Current Outpatient Prescriptions on File Prior to Visit  Medication Sig Dispense Refill  . aspirin 81 MG chewable tablet Chew 1 tablet (81 mg total) by mouth daily. (Patient not taking: Reported on 03/10/2016) 30 tablet 3  . triamcinolone cream (KENALOG) 0.1 % Apply 1 application topically 4 (four) times daily as needed. For rash (Patient not taking: Reported on 03/10/2016) 45 g 2   No current facility-administered medications on file prior to visit.    ROS as in subjective   Objective BP 130/70   Pulse 75   Temp 98.2 F (36.8 C)   Wt 203 lb (92.1 kg)   SpO2 96%   BMI 33.78 kg/m   General appearance: alert, no distress, WD/WN, AA  male Neck: supple, no lymphadenopathy, no thyromegaly, no masses, no bruits Heart: RRR, normal S1, S2, no murmurs Lungs: CTA bilaterally, no wheezes, rhonchi, or rales Abdomen: +bs, soft, non tender, non distended, no masses, no hepatomegaly, no splenomegaly Pulses: 2+ symmetric, upper and lower extremities, normal cap refill Ext:no edema GU: normal male, circumcised, no mass, no tenderness or swelling, no lymphadenopathy DRE: declined     Assessment: Encounter Diagnoses  Name Primary?  . Urine frequency Yes  . Lower abdominal tenderness   . Impaired fasting blood sugar   . Erectile dysfunction, unspecified erectile dysfunction type   . Screen for STD (sexually transmitted disease)   . Palpitations   . Hypothyroidism, unspecified type     Plan: Urinary frequency, lower abdominal tenderness - UA unremarkable.   Will send for urine culture, GC chlamydia.    In the meantime, begin Cipro for possible prostatitis  impaired glucose, ED concerns - he is not compliant with his synthroid.   Restart thyroid medication.  Will request records from cardiology eval from last year.  He was seen for palpitations but we have not seen the cardiology notes.  Consider trial of Viagra.    Hypothyroidism - restart medication, f/u with  endocrinology,and discussed compliance  Advised fasting lipids, physical in the near future  Jonathan Mcmahon was seen today for possible  uti.  Diagnoses and all orders for this visit:  Urine frequency -     Urinalysis Dipstick -     GC/Chlamydia Probe Amp -     Urine culture  Lower abdominal tenderness  Impaired fasting blood sugar -     POCT glycosylated hemoglobin (Hb A1C)  Erectile dysfunction, unspecified erectile dysfunction type  Screen for STD (sexually transmitted disease)  Palpitations  Hypothyroidism, unspecified type  Other orders -     ciprofloxacin (CIPRO) 500 MG tablet; Take 1 tablet (500 mg total) by mouth 2 (two) times daily. -      levothyroxine (SYNTHROID, LEVOTHROID) 100 MCG tablet; Take 1 tablet (100 mcg total) by mouth daily.

## 2016-03-11 ENCOUNTER — Telehealth: Payer: Self-pay

## 2016-03-11 LAB — GC/CHLAMYDIA PROBE AMP
CT Probe RNA: NOT DETECTED
GC Probe RNA: NOT DETECTED

## 2016-03-11 NOTE — Telephone Encounter (Signed)
OV notes from Dr. Sharyn LullHarwani placed in your folder for review. Trixie Rude/RLB

## 2016-03-12 LAB — URINE CULTURE: Organism ID, Bacteria: NO GROWTH

## 2016-03-28 ENCOUNTER — Other Ambulatory Visit: Payer: Self-pay | Admitting: Medical

## 2016-03-28 MED ORDER — SILDENAFIL CITRATE 50 MG PO TABS: 50.0000 mg | ORAL_TABLET | Freq: Every day | ORAL | 0 refills | Status: DC | PRN

## 2016-04-06 NOTE — Progress Notes (Deleted)
   Subjective:    Patient ID: Jonathan Mcmahon, male    DOB: [birth year], 46 y.o.   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    Review of Systems     Objective:   Physical Exam VITAL SIGNS:  See vs page GENERAL: no distress NECK: There is no palpable thyroid enlargement.  No thyroid nodule is palpable.  No palpable lymphadenopathy at the anterior neck.       Assessment & Plan:

## 2016-04-07 ENCOUNTER — Ambulatory Visit: Payer: Managed Care, Other (non HMO) | Admitting: Endocrinology

## 2016-04-14 ENCOUNTER — Encounter: Payer: Self-pay | Admitting: Medical

## 2016-04-28 ENCOUNTER — Ambulatory Visit: Payer: Managed Care, Other (non HMO) | Admitting: Endocrinology

## 2016-07-31 ENCOUNTER — Other Ambulatory Visit: Payer: Self-pay | Admitting: Endocrinology

## 2016-09-06 ENCOUNTER — Other Ambulatory Visit: Payer: Self-pay | Admitting: Medical

## 2016-10-05 ENCOUNTER — Other Ambulatory Visit: Payer: Self-pay | Admitting: Medical

## 2016-12-26 ENCOUNTER — Other Ambulatory Visit: Payer: Self-pay | Admitting: Medical

## 2016-12-31 ENCOUNTER — Other Ambulatory Visit: Payer: Self-pay | Admitting: Medical

## 2017-01-26 ENCOUNTER — Encounter: Payer: Self-pay | Admitting: Medical

## 2017-02-23 ENCOUNTER — Encounter: Payer: Self-pay | Admitting: Medical

## 2017-05-27 IMAGING — CT CT RENAL STONE PROTOCOL
2 of 3 series · 16 of 44 positions shown, 18 images · non-contrast
Comparison: MRI of the liver performed 08/15/2014, and abdominal
ultrasound performed 08/10/2014. CT of the abdomen and pelvis
performed 01/22/2008

CLINICAL DATA: Acute onset of right flank pain and vomiting.
Initial encounter.

EXAM:
CT ABDOMEN AND PELVIS WITHOUT CONTRAST
TECHNIQUE: Multidetector CT imaging of the abdomen and pelvis was performed
following the standard protocol without IV contrast.

[Series 4: lung · axial · 0.77mm/px · z∈[-168,-43]mm · 13 of 30 slices shown, 15 images]
[im 3/30  soft-tissue]
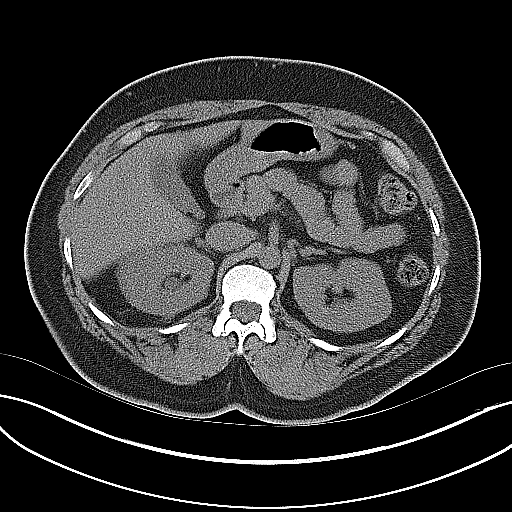
[im 3/30  bone]
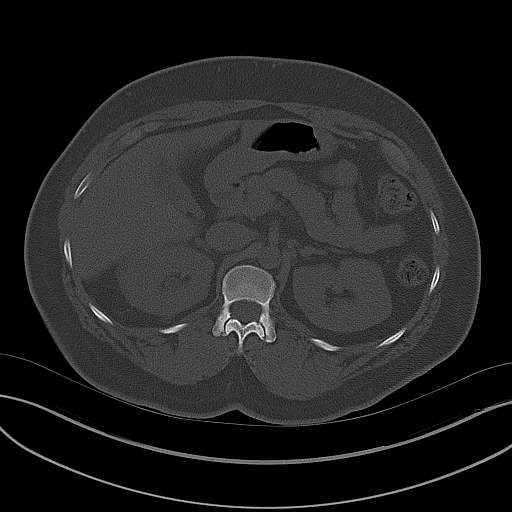
[im 5/30  soft-tissue]
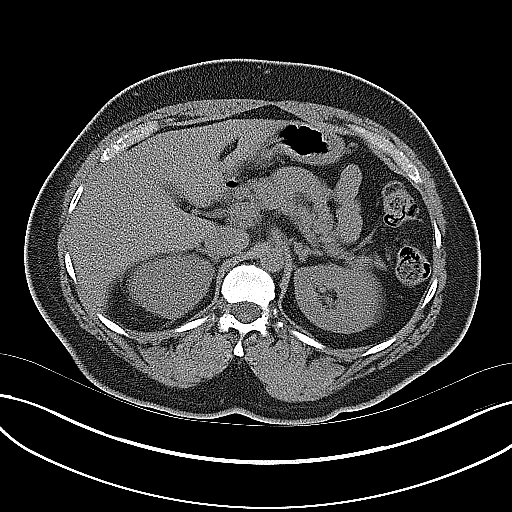
[im 7/30  soft-tissue]
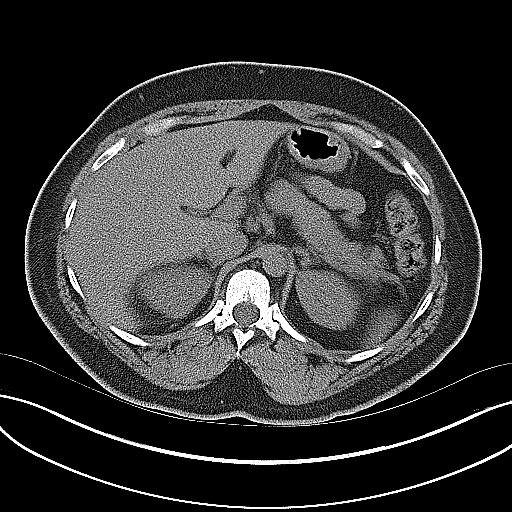
[im 9/30  soft-tissue]
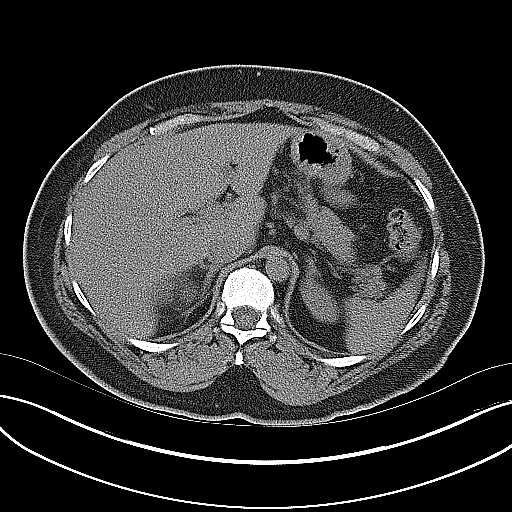
[im 11/30  soft-tissue]
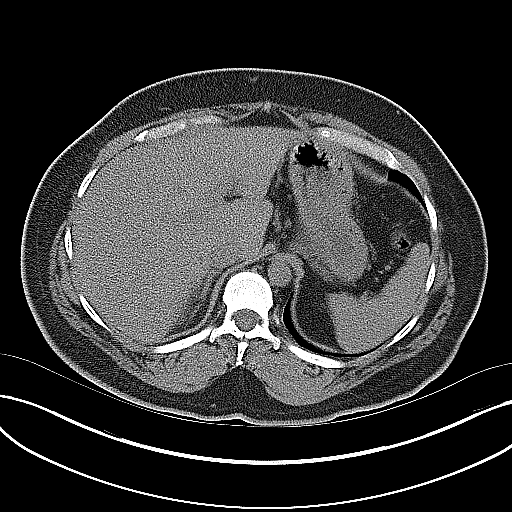
[im 13/30  soft-tissue]
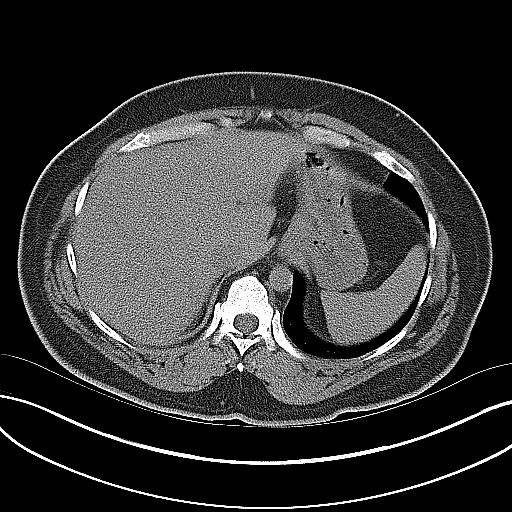
[im 16/30  soft-tissue]
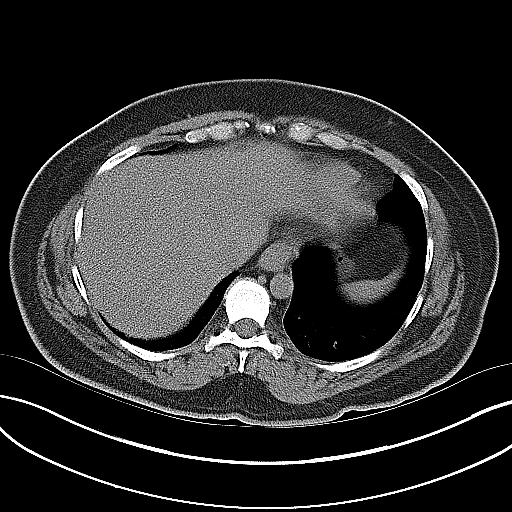
[im 18/30  soft-tissue]
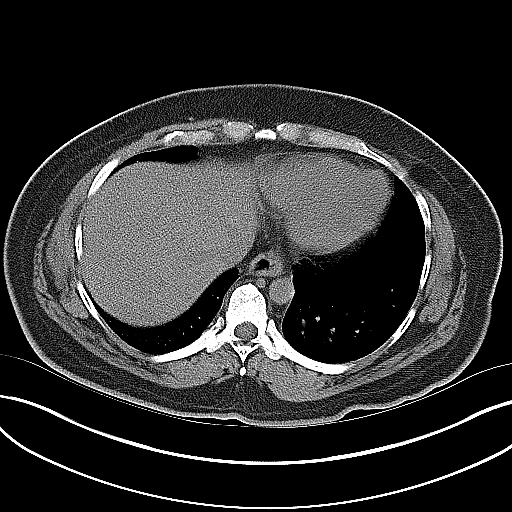
[im 20/30  soft-tissue]
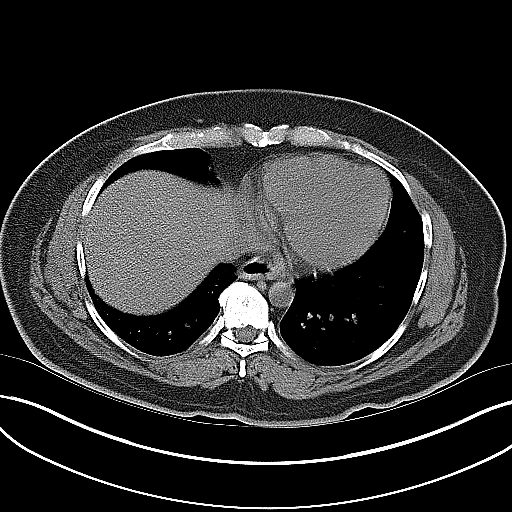
[im 20/30  bone]
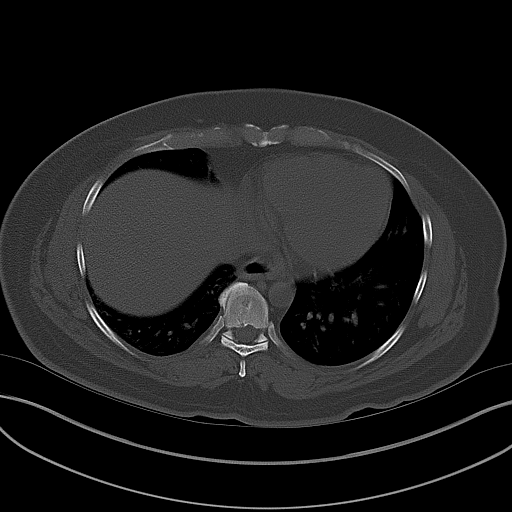
[im 22/30  soft-tissue]
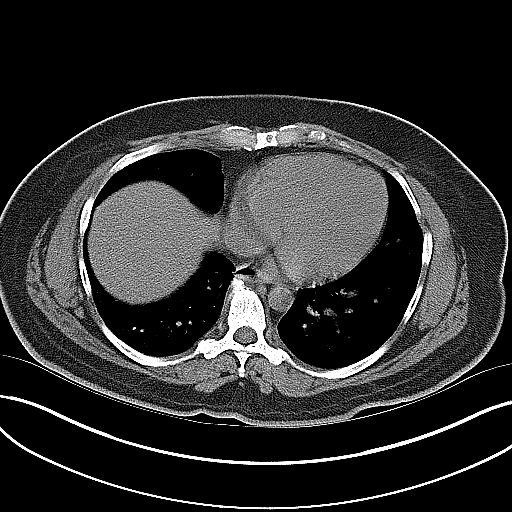
[im 24/30  soft-tissue]
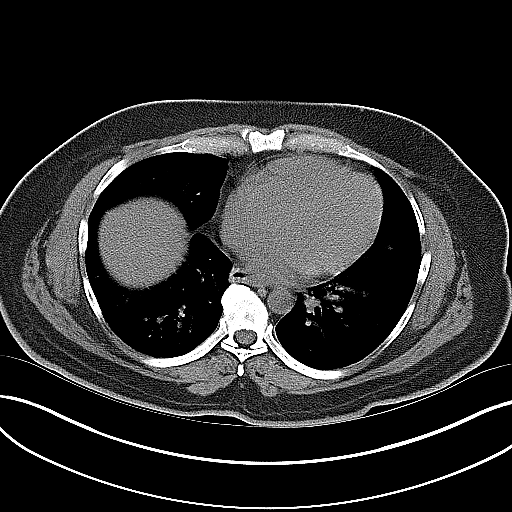
[im 26/30  soft-tissue]
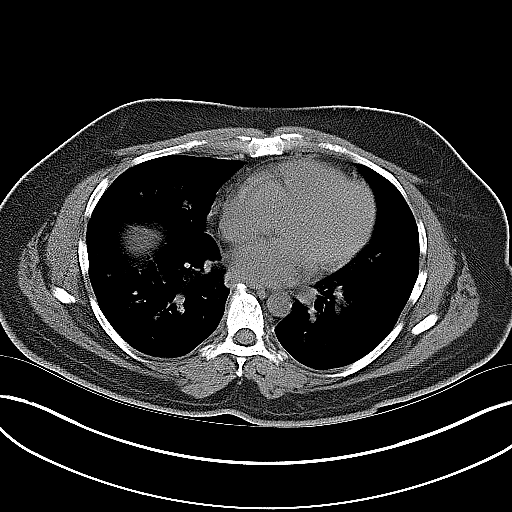
[im 28/30  soft-tissue]
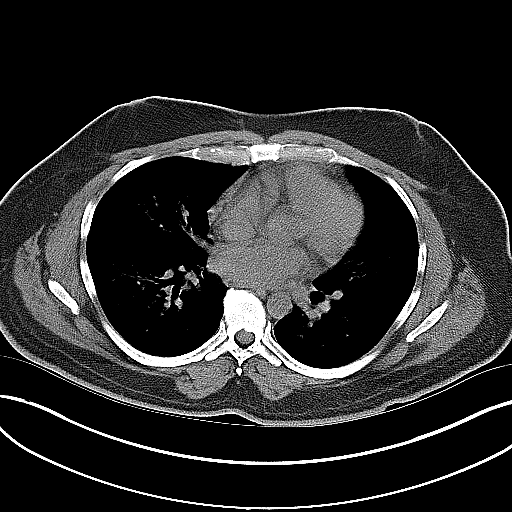

[Series 5: coronal · coronal · 0.74mm/px · 3 of 93 slices shown]
[im 31/93  soft-tissue]
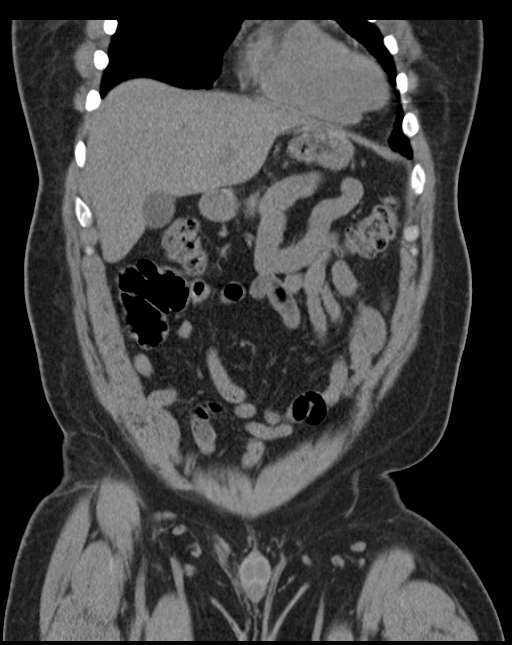
[im 41/93  soft-tissue]
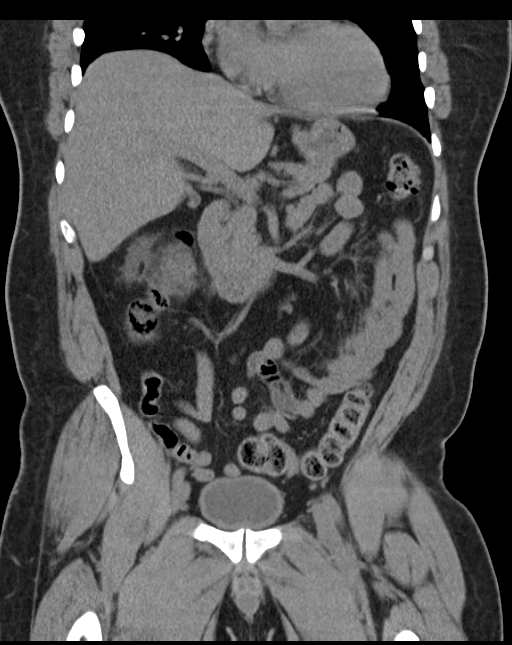
[im 52/93  soft-tissue]
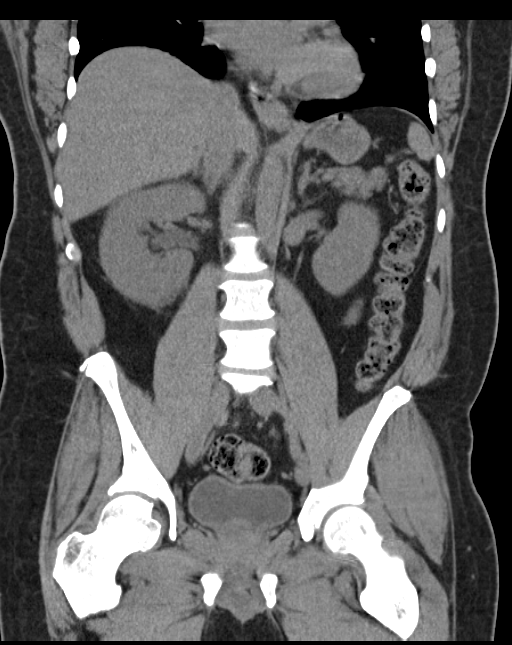

[16 of 44 positions shown; findings below may reference images not displayed]

FINDINGS: The visualized lung bases are clear.

The liver and spleen are unremarkable in appearance. The gallbladder
is within normal limits. The pancreas and adrenal glands are
unremarkable.

Minimal right-sided hydronephrosis is noted, with right-sided
perinephric stranding and fluid, and an obstructing 3 mm stone noted
at the right side of the base of the bladder, along the right
vesicoureteral junction. A 1.9 cm cyst is noted at the lower pole of
the left kidney, mildly increased in size from 8882. No
nonobstructing renal stones are identified.

No free fluid is identified. The small bowel is unremarkable in
appearance. The stomach is within normal limits. No acute vascular
abnormalities are seen.

The patient is status post appendectomy. The colon is unremarkable
in appearance.

The bladder is mildly distended and otherwise unremarkable. The
prostate is unremarkable in appearance. No inguinal lymphadenopathy
is seen.

No acute osseous abnormalities are identified. Multilevel vacuum
phenomenon is noted at the lower lumbar spine.
IMPRESSION: 1. Minimal right-sided hydronephrosis, with an obstructing 3 mm
stone noted at the right side of the base of the bladder, along the
right vesicoureteral junction.
2. Small left renal cyst noted.
3. Mild degenerative change at the lower lumbar spine.

## 2017-07-19 IMAGING — NM NM RAI THERAPY FOR HYPERTHYROIDISM
1 series · 1 of 1 positions shown · non-contrast
Comparison: Thyroid ultrasound 04/06/2015. Nuclear medicine thyroid
scan and uptake 07/17/2015

CLINICAL DATA: Hyperthyroidism.  Graves disease.

EXAM:
RADIOACTIVE IODINE THERAPY FOR HYPERTHYROIDISM
TECHNIQUE: Radioactive iodine prescribed by myself after reviewing the
patient's prior studies on 07/18/2015. The risks and benefits of
radioactive iodine therapy were discussed with the patient in detail
by Dr. Aparecida. Alternative therapies were also mentioned.
Radiation safety was discussed with the patient, including how to
protect the general public from exposure. There were no barriers to
communication. Written consent was obtained. The patient then
received a capsule containing the radiopharmaceutical.
The patient will follow-up with the referring physician.
RADIOPHARMACEUTICALS:  20.8 mCi F-5W5 sodium iodide orally

[Series 1: static · 2.07mm/px · 1 of 1 slices shown]
[im 1/1]
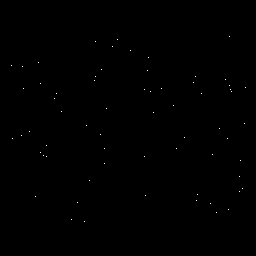

[1 of 1 positions shown; findings below may reference images not displayed]

IMPRESSION: Per oral administration of F-5W5 sodium iodide for the treatment of
hyperthyroidism.

## 2017-11-13 ENCOUNTER — Ambulatory Visit: Payer: Managed Care, Other (non HMO) | Admitting: Endocrinology

## 2017-11-13 DIAGNOSIS — Z0289 Encounter for other administrative examinations: Secondary | ICD-10-CM

## 2018-05-10 DIAGNOSIS — J069 Acute upper respiratory infection, unspecified: Secondary | ICD-10-CM | POA: Diagnosis not present

## 2018-05-18 ENCOUNTER — Encounter: Payer: Self-pay | Admitting: Endocrinology

## 2018-05-18 ENCOUNTER — Ambulatory Visit: Payer: BLUE CROSS/BLUE SHIELD | Admitting: Medical

## 2018-05-18 ENCOUNTER — Ambulatory Visit: Payer: Self-pay | Admitting: Endocrinology

## 2018-05-18 ENCOUNTER — Encounter: Payer: Self-pay | Admitting: Medical

## 2018-05-18 VITALS — BP 110/70 | HR 70 | Temp 98.1°F | Resp 16 | Ht 65.0 in | Wt 210.2 lb

## 2018-05-18 VITALS — BP 126/78 | HR 79 | Ht 65.0 in | Wt 211.0 lb

## 2018-05-18 DIAGNOSIS — G8929 Other chronic pain: Secondary | ICD-10-CM

## 2018-05-18 DIAGNOSIS — Z91199 Patient's noncompliance with other medical treatment and regimen due to unspecified reason: Secondary | ICD-10-CM

## 2018-05-18 DIAGNOSIS — R3911 Hesitancy of micturition: Secondary | ICD-10-CM

## 2018-05-18 DIAGNOSIS — E079 Disorder of thyroid, unspecified: Secondary | ICD-10-CM

## 2018-05-18 DIAGNOSIS — R270 Ataxia, unspecified: Secondary | ICD-10-CM

## 2018-05-18 DIAGNOSIS — R05 Cough: Secondary | ICD-10-CM | POA: Insufficient documentation

## 2018-05-18 DIAGNOSIS — R3129 Other microscopic hematuria: Secondary | ICD-10-CM | POA: Diagnosis not present

## 2018-05-18 DIAGNOSIS — M549 Dorsalgia, unspecified: Secondary | ICD-10-CM

## 2018-05-18 DIAGNOSIS — R0602 Shortness of breath: Secondary | ICD-10-CM

## 2018-05-18 DIAGNOSIS — R7301 Impaired fasting glucose: Secondary | ICD-10-CM

## 2018-05-18 DIAGNOSIS — R29898 Other symptoms and signs involving the musculoskeletal system: Secondary | ICD-10-CM | POA: Diagnosis not present

## 2018-05-18 DIAGNOSIS — R202 Paresthesia of skin: Secondary | ICD-10-CM | POA: Diagnosis not present

## 2018-05-18 DIAGNOSIS — R39198 Other difficulties with micturition: Secondary | ICD-10-CM | POA: Insufficient documentation

## 2018-05-18 DIAGNOSIS — R059 Cough, unspecified: Secondary | ICD-10-CM | POA: Insufficient documentation

## 2018-05-18 DIAGNOSIS — Z9119 Patient's noncompliance with other medical treatment and regimen: Secondary | ICD-10-CM | POA: Insufficient documentation

## 2018-05-18 DIAGNOSIS — E890 Postprocedural hypothyroidism: Secondary | ICD-10-CM

## 2018-05-18 LAB — POCT URINALYSIS DIP (PROADVANTAGE DEVICE)
Bilirubin, UA: NEGATIVE
Glucose, UA: NEGATIVE mg/dL
Ketones, POC UA: NEGATIVE mg/dL
Leukocytes, UA: NEGATIVE
Nitrite, UA: NEGATIVE
PH UA: 6 (ref 5.0–8.0)
Protein Ur, POC: NEGATIVE mg/dL
SPECIFIC GRAVITY, URINE: 1.03
Urobilinogen, Ur: NEGATIVE

## 2018-05-18 LAB — POCT GLYCOSYLATED HEMOGLOBIN (HGB A1C): Hemoglobin A1C: 6 % — AB (ref 4.0–5.6)

## 2018-05-18 MED ORDER — LEVOTHYROXINE SODIUM 100 MCG PO TABS: 100.0000 ug | ORAL_TABLET | Freq: Every day | ORAL | 1 refills | Status: DC

## 2018-05-18 NOTE — Progress Notes (Signed)
Subjective:    Patient ID: Jonathan Mcmahon, male    DOB: 1970-05-18, 48 y.o.   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  Pt says he has not recently taken the synthroid.  He has swelling of the legs.   Past Medical History:  Diagnosis Date  . Cyst of left kidney   . Disc disorder    "MRI bulging disc L4 and L5"  . Fatty liver   . Kidney stone     Past Surgical History:  Procedure Laterality Date  . APPENDECTOMY      Social History   Socioeconomic History  . Marital status: Married    Spouse name: Not on file  . Number of children: 3  . Years of education: 34  . Highest education level: Not on file  Occupational History  . Occupation: Truck Public librarian  . Financial resource strain: Not on file  . Food insecurity:    Worry: Not on file    Inability: Not on file  . Transportation needs:    Medical: Not on file    Non-medical: Not on file  Tobacco Use  . Smoking status: Never Smoker  . Smokeless tobacco: Never Used  Substance and Sexual Activity  . Alcohol use: No    Alcohol/week: 0.0 standard drinks  . Drug use: No  . Sexual activity: Not on file  Lifestyle  . Physical activity:    Days per week: Not on file    Minutes per session: Not on file  . Stress: Not on file  Relationships  . Social connections:    Talks on phone: Not on file    Gets together: Not on file    Attends religious service: Not on file    Active member of club or organization: Not on file    Attends meetings of clubs or organizations: Not on file    Relationship status: Not on file  . Intimate partner violence:    Fear of current or ex partner: Not on file    Emotionally abused: Not on file    Physically abused: Not on file    Forced sexual activity: Not on file  Other Topics Concern  . Not on file    Social History Narrative   Walks for exercise some.  Recent changed eating habits to more healthy.  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     Drinks about a couple cups of tea a week     No current outpatient medications on file prior to visit.   No current facility-administered medications on file prior to visit.     No Known Allergies  Family History  Problem Relation Age of Onset  . Renal cancer Mother   . CAD Father   . Heart disease Father 24       died of MI  . Diabetes Father   . Gallbladder disease Neg Hx   . Thyroid disease Neg Hx     BP 126/78 (BP Location: Left Arm, Patient Position: Sitting, Cuff Size: Large)   Pulse 79   Ht 5\' 5"  (1.651 m)   Wt 211 lb (95.7 kg)   SpO2 95%   BMI 35.11 kg/m    Review of Systems No weight change.  Objective:   Physical Exam VITAL SIGNS:  See vs page GENERAL: no distress NECK: There is no palpable thyroid enlargement.  No thyroid nodule is palpable.  No palpable lymphadenopathy at the anterior neck.       Assessment & Plan:  Post-RAI hypothyroidism: due for recheck Noncompliance with medication.  We discussed.  I have sent a prescription to your pharmacy, in expectation of abnormal results.

## 2018-05-18 NOTE — Progress Notes (Signed)
Subjective:     Patient ID: Jonathan Mcmahon, male   DOB: September 26, 1970, 48 y.o.   MRN: [id number]  HPI Chief Complaint  Patient presents with  . leg numbness    feet numbness and leg numbness, numbness rectum X 2 weeks   Here for acute symptoms.  His last visit here was December 2017.  Here with wife today.  He has a history of abnormal thyroid function, enlarged prostate, impaired fasting glucose, erectile dysfunction, hx/o mild sleep apnea on prior sleep study, fatty liver disease, and is noncompliant with his medications per last visit.  Had flu 3 weeks ago, was taking Mucinex.   Since then he wondered if he was having a reaction to mucinex.   He reports new symptoms in legs and feet.  Been having numbness and burning in feet, swelling in ankle and tops of feet, some in lower leg, coloration changes in both lower legs.  Been having these symptoms 2 weeks.    No recent injury, no trauma, no fall.     Balance seems a little off.  He was seeing endocrinology in the past, but hasn't seen them since 2017 either.   Has appt today with Dr. Everardo All, endocrinology.  Hasn't taken any thyroid medication in over a year.  Denies polyuria or polydipsia.   At times urine stream seems weaker, has some hesitation.  Sometimes rectum feels numb after bowel movement.    Still had ED problems.    He has ongoing cough, does endorse some shortness of breath and dyspnea on exertion.  No chest pain though  Past Medical History:  Diagnosis Date  . Cyst of left kidney   . Disc disorder    "MRI bulging disc L4 and L5"  . Fatty liver   . Kidney stone    Past Surgical History:  Procedure Laterality Date  . APPENDECTOMY     ROS as in subjective   Review of Systems     Objective:   Physical Exam BP 110/70   Pulse 70   Temp 98.1 F (36.7 C) (Oral)   Resp 16   Ht 5\' 5"  (1.651 m)   Wt 210 lb 3.2 oz (95.3 kg)   SpO2 95%   BMI 34.98 kg/m   Wt Readings from Last 3 Encounters:  05/18/18 210 lb 3.2 oz  (95.3 kg)  03/10/16 203 lb (92.1 kg)  10/22/15 196 lb 6.4 oz (89.1 kg)   General appearance: alert, no distress, WD/WN, obese AA male HEENT: normocephalic, sclerae anicteric, TMs pearly, nares patent, no discharge or erythema, pharynx normal Oral cavity: MMM, no lesions Neck: supple, no lymphadenopathy, no thyromegaly, no masses, no bruits Heart: RRR, normal S1, S2, no murmurs Lungs: CTA bilaterally, no wheezes, rhonchi, or rales Abdomen: +bs, soft, non tender, non distended, no masses, no hepatomegaly, no splenomegaly Pulses: 2+ upper extremity pulses, 1+ pedal pulses, normal cap refill There is mild puffy swelling of the dorsal feet and distal lower extremities bilaterally, there is a faint purplish discoloration of distal medial bilateral lower legs and medial feet in general Neuro: DTRs 3+ throughout, sensation of legs and feet from mid shaft of legs to feet all with decreased sensation, dullness to sensation throughout but symmetrical, he seems somewhat unstable on his feet, cautious with heel and toe walk although he can do this somewhat, at one point he almost lost his balance just walking in the room, strength overall and legs seem to decrease compared to upper extremities but symmetrical Skin no other  changes other than discoloration above lower extremities  EKG  EKG indication, shortness of breath.  Rate 58 bpm, PR interval 176 ms, QRS 90 ms, QTC 390 ms, axis 56 degrees, sinus bradycardia, RSR' prime in V5, P waves enlarged, possible atrial enlargement.     Assessment:     Encounter Diagnoses  Name Primary?  . Thyroid disease Yes  . Other microscopic hematuria   . Weakness of both lower extremities   . Ataxia   . SOB (shortness of breath)   . Cough   . Paresthesia   . Noncompliance   . Urinary hesitancy   . Decreased urine stream   . Chronic back pain, unspecified back location, unspecified back pain laterality        Plan:     We discussed his several symptoms.  We  discussed that some of the symptoms could be related but then some of the other ones may not be related.  We discussed wide differential for his collection of symptoms including: Thyroiditis, abnormal thyroid function, possible diabetes, prostate enlargement, sarcoidosis, cardiac issue, neurologic issue that could be related to viral syndrome, other.  The most concerning symptoms is his new numbness in his feet, weakness in the legs that is acute.  He has a history of chronic back pain with bulging disc on prior MRI, he has a history of impaired glucose, he has history of thyroid disease and not compliant with medication the past year.  His wife notes that he had a thyroiditis in the past with illness that led to leg symptoms, like he has today.  He also has some new dyspnea on exertion, some prostate related symptoms.  We will check labs today, EKG, as a starting point.  Microscopic hematuria, urinary changes - PSA today, likely referral to urology given +urine microscopic  Kamarien was seen today for leg numbness.  Diagnoses and all orders for this visit:  Thyroid disease -     TSH -     T4, free -     POCT glycosylated hemoglobin (Hb A1C) -     Vitamin B12 -     T3  Other microscopic hematuria -     POCT Urinalysis DIP (Proadvantage Device)  Weakness of both lower extremities -     Comprehensive metabolic panel -     CBC with Differential/Platelet -     TSH -     T4, free -     POCT glycosylated hemoglobin (Hb A1C)  Ataxia -     Comprehensive metabolic panel -     CBC with Differential/Platelet -     TSH -     T4, free -     POCT glycosylated hemoglobin (Hb A1C)  SOB (shortness of breath) -     EKG 12-Lead  Cough  Paresthesia -     EKG 12-Lead -     Comprehensive metabolic panel -     CBC with Differential/Platelet -     TSH -     T4, free -     POCT glycosylated hemoglobin (Hb A1C)  Noncompliance  Urinary hesitancy -     PSA  Decreased urine stream -      PSA  Chronic back pain, unspecified back location, unspecified back pain laterality

## 2018-05-18 NOTE — Patient Instructions (Addendum)
We'll let you know about the results of today's blood tests.  I have sent a prescription to your pharmacy, for the thyroid pill, because the test results will probably show you should resume it.  Please come back for a follow-up appointment in 1 month.

## 2018-05-19 ENCOUNTER — Other Ambulatory Visit: Payer: Self-pay | Admitting: Medical

## 2018-05-19 DIAGNOSIS — R319 Hematuria, unspecified: Secondary | ICD-10-CM

## 2018-05-19 DIAGNOSIS — K921 Melena: Secondary | ICD-10-CM

## 2018-05-19 LAB — COMPREHENSIVE METABOLIC PANEL
ALK PHOS: 63 IU/L (ref 39–117)
ALT: 36 IU/L (ref 0–44)
AST: 26 IU/L (ref 0–40)
Albumin/Globulin Ratio: 1.7 (ref 1.2–2.2)
Albumin: 4.5 g/dL (ref 4.0–5.0)
BILIRUBIN TOTAL: 0.4 mg/dL (ref 0.0–1.2)
BUN/Creatinine Ratio: 14 (ref 9–20)
BUN: 11 mg/dL (ref 6–24)
CHLORIDE: 104 mmol/L (ref 96–106)
CO2: 18 mmol/L — ABNORMAL LOW (ref 20–29)
CREATININE: 0.8 mg/dL (ref 0.76–1.27)
Calcium: 9.7 mg/dL (ref 8.7–10.2)
GFR calc Af Amer: 125 mL/min/{1.73_m2} (ref >59)
GFR calc non Af Amer: 108 mL/min/{1.73_m2} (ref >59)
GLOBULIN, TOTAL: 2.6 g/dL (ref 1.5–4.5)
GLUCOSE: 83 mg/dL (ref 65–99)
Potassium: 4.4 mmol/L (ref 3.5–5.2)
SODIUM: 139 mmol/L (ref 134–144)
Total Protein: 7.1 g/dL (ref 6.0–8.5)

## 2018-05-19 LAB — CBC WITH DIFFERENTIAL/PLATELET
BASOS ABS: 0 10*3/uL (ref 0.0–0.2)
Basos: 1 %
EOS (ABSOLUTE): 0.2 10*3/uL (ref 0.0–0.4)
EOS: 3 %
Hematocrit: 40.2 % (ref 37.5–51.0)
Hemoglobin: 13.3 g/dL (ref 13.0–17.7)
IMMATURE GRANULOCYTES: 0 %
Immature Grans (Abs): 0 10*3/uL (ref 0.0–0.1)
LYMPHS ABS: 1.6 10*3/uL (ref 0.7–3.1)
Lymphs: 27 %
MCH: 30.5 pg (ref 26.6–33.0)
MCHC: 33.1 g/dL (ref 31.5–35.7)
MCV: 92 fL (ref 79–97)
MONOS ABS: 0.6 10*3/uL (ref 0.1–0.9)
Monocytes: 11 %
NEUTROS PCT: 58 %
Neutrophils Absolute: 3.5 10*3/uL (ref 1.4–7.0)
PLATELETS: 282 10*3/uL (ref 150–450)
RBC: 4.36 x10E6/uL (ref 4.14–5.80)
RDW: 12.4 % (ref 11.6–15.4)
WBC: 5.9 10*3/uL (ref 3.4–10.8)

## 2018-05-19 LAB — T4, FREE: Free T4: 0.6 ng/dL — ABNORMAL LOW (ref 0.82–1.77)

## 2018-05-19 LAB — PSA: Prostate Specific Ag, Serum: 0.5 ng/mL (ref 0.0–4.0)

## 2018-05-19 LAB — T3: T3, Total: 96 ng/dL (ref 71–180)

## 2018-05-19 LAB — VITAMIN B12: Vitamin B-12: 554 pg/mL (ref 232–1245)

## 2018-05-19 LAB — TSH: TSH: 25.32 u[IU]/mL — ABNORMAL HIGH (ref 0.450–4.500)

## 2018-05-19 MED ORDER — LEVOTHYROXINE SODIUM 50 MCG PO TABS: 50.0000 ug | ORAL_TABLET | Freq: Every day | ORAL | 0 refills | Status: DC

## 2018-06-07 ENCOUNTER — Ambulatory Visit: Payer: BLUE CROSS/BLUE SHIELD | Admitting: Medical

## 2018-06-07 ENCOUNTER — Ambulatory Visit: Payer: BLUE CROSS/BLUE SHIELD | Admitting: Endocrinology

## 2018-06-28 ENCOUNTER — Ambulatory Visit (INDEPENDENT_AMBULATORY_CARE_PROVIDER_SITE_OTHER): Payer: BLUE CROSS/BLUE SHIELD | Admitting: Endocrinology

## 2018-06-28 ENCOUNTER — Other Ambulatory Visit: Payer: Self-pay

## 2018-06-28 ENCOUNTER — Ambulatory Visit: Payer: BLUE CROSS/BLUE SHIELD | Admitting: Medical

## 2018-06-28 DIAGNOSIS — E890 Postprocedural hypothyroidism: Secondary | ICD-10-CM

## 2018-06-28 DIAGNOSIS — R202 Paresthesia of skin: Secondary | ICD-10-CM | POA: Diagnosis not present

## 2018-06-28 NOTE — Progress Notes (Addendum)
Subjective:    Patient ID: Jonathan Mcmahon, male    DOB: 04-21-1970, 48 y.o.   MRN: [id number]  HPI  telehealth visit today via doxy video visit.  Alternatives to telehealth are presented to this patient, and the patient agrees to the telehealth visit. Pt is advised of the cost of the visit, and agrees to this, also.   Patient is at home, and I am at the office.   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  Pt says he has not recently taken the synthroid 50 mcg/day, as rx'ed.  He has slight tingling of the feet, but no assoc pain Past Medical History:  Diagnosis Date  . Cyst of left kidney   . Disc disorder    "MRI bulging disc L4 and L5"  . Fatty liver   . Kidney stone     Past Surgical History:  Procedure Laterality Date  . APPENDECTOMY      Social History   Socioeconomic History  . Marital status: Married    Spouse name: Not on file  . Number of children: 3  . Years of education: 22  . Highest education level: Not on file  Occupational History  . Occupation: Truck Public librarian  . Financial resource strain: Not on file  . Food insecurity:    Worry: Not on file    Inability: Not on file  . Transportation needs:    Medical: Not on file    Non-medical: Not on file  Tobacco Use  . Smoking status: Never Smoker  . Smokeless tobacco: Never Used  Substance and Sexual Activity  . Alcohol use: No    Alcohol/week: 0.0 standard drinks  . Drug use: No  . Sexual activity: Not on file  Lifestyle  . Physical activity:    Days per week: Not on file    Minutes per session: Not on file  . Stress: Not on file  Relationships  . Social connections:    Talks on phone: Not on file    Gets together: Not on file    Attends religious service: Not on file    Active member of  club or organization: Not on file    Attends meetings of clubs or organizations: Not on file    Relationship status: Not on file  . Intimate partner violence:    Fear of current or ex partner: Not on file    Emotionally abused: Not on file    Physically abused: Not on file    Forced sexual activity: Not on file  Other Topics Concern  . Not on file  Social History Narrative   Walks for exercise some.  Recent changed eating habits to more healthy.  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     Drinks about a couple cups of tea a week     Current Outpatient Medications on File Prior to Visit  Medication Sig Dispense Refill  . levothyroxine (SYNTHROID) 50 MCG tablet Take 1 tablet (50 mcg total) by mouth daily. 30 tablet 0   No current facility-administered medications on file prior to visit.     No Known Allergies  Family History  Problem Relation Age of Onset  . Renal cancer Mother   .  CAD Father   . Heart disease Father 28       died of MI  . Diabetes Father   . Gallbladder disease Neg Hx   . Thyroid disease Neg Hx     Review of Systems No weight change.  Denies leg edema.      Objective:   Physical Exam      Assessment & Plan:  Hypothyroidism: due for recheck. Paresthesias, new to me.  As B-12 was recently normal, this may be thyroid-related  Patient Instructions  Blood tests are requested for you today.  We'll let you know about the results.  Please come back for a follow-up appointment in 6 months.

## 2018-06-28 NOTE — Patient Instructions (Signed)
Blood tests are requested for you today.  We'll let you know about the results.  Please come back for a follow-up appointment in 6 months.   

## 2018-07-10 ENCOUNTER — Other Ambulatory Visit: Payer: Self-pay | Admitting: Endocrinology

## 2018-07-13 ENCOUNTER — Other Ambulatory Visit: Payer: Self-pay

## 2018-07-13 NOTE — Progress Notes (Signed)
From Dr. Everardo All:  he was seen 2 weeks ago.  i did letter, to have labs drawn near to him.  please fax to whatever lab or facility pt wants to have drawn.  Message received from front desk:  fax lab orders to [phone] - this is a lab at [facility] in [place], [place].  Patient was advised that after lab results were sent to [place] that we would call with results once received and patient verbalized understanding  Lab orders faxed as requested above. Confirmation received.

## 2018-07-14 DIAGNOSIS — E039 Hypothyroidism, unspecified: Secondary | ICD-10-CM | POA: Diagnosis not present

## 2018-07-15 ENCOUNTER — Telehealth: Payer: Self-pay | Admitting: Endocrinology

## 2018-07-15 NOTE — Telephone Encounter (Signed)
Called pt and informed of orders below. Verbalized acceptance and understanding.

## 2018-07-15 NOTE — Telephone Encounter (Signed)
please contact patient: Normal results.  Please continue the same medication. I'll see you next time.

## 2018-07-20 ENCOUNTER — Other Ambulatory Visit: Payer: Self-pay

## 2018-07-20 ENCOUNTER — Ambulatory Visit (INDEPENDENT_AMBULATORY_CARE_PROVIDER_SITE_OTHER): Payer: BLUE CROSS/BLUE SHIELD | Admitting: Medical

## 2018-07-20 ENCOUNTER — Telehealth: Payer: Self-pay | Admitting: Medical

## 2018-07-20 ENCOUNTER — Encounter: Payer: Self-pay | Admitting: Medical

## 2018-07-20 VITALS — Ht 65.0 in | Wt 208.0 lb

## 2018-07-20 DIAGNOSIS — R3129 Other microscopic hematuria: Secondary | ICD-10-CM

## 2018-07-20 DIAGNOSIS — R270 Ataxia, unspecified: Secondary | ICD-10-CM

## 2018-07-20 DIAGNOSIS — E079 Disorder of thyroid, unspecified: Secondary | ICD-10-CM | POA: Diagnosis not present

## 2018-07-20 DIAGNOSIS — G8929 Other chronic pain: Secondary | ICD-10-CM

## 2018-07-20 DIAGNOSIS — R609 Edema, unspecified: Secondary | ICD-10-CM | POA: Insufficient documentation

## 2018-07-20 DIAGNOSIS — R202 Paresthesia of skin: Secondary | ICD-10-CM | POA: Diagnosis not present

## 2018-07-20 DIAGNOSIS — R29898 Other symptoms and signs involving the musculoskeletal system: Secondary | ICD-10-CM

## 2018-07-20 DIAGNOSIS — N529 Male erectile dysfunction, unspecified: Secondary | ICD-10-CM

## 2018-07-20 DIAGNOSIS — R200 Anesthesia of skin: Secondary | ICD-10-CM | POA: Diagnosis not present

## 2018-07-20 DIAGNOSIS — E890 Postprocedural hypothyroidism: Secondary | ICD-10-CM

## 2018-07-20 DIAGNOSIS — R7301 Impaired fasting glucose: Secondary | ICD-10-CM

## 2018-07-20 DIAGNOSIS — R269 Unspecified abnormalities of gait and mobility: Secondary | ICD-10-CM

## 2018-07-20 DIAGNOSIS — M549 Dorsalgia, unspecified: Secondary | ICD-10-CM

## 2018-07-20 MED ORDER — HYDROCHLOROTHIAZIDE 25 MG PO TABS: 25.0000 mg | ORAL_TABLET | Freq: Every day | ORAL | Status: DC

## 2018-07-20 MED ORDER — POTASSIUM CHLORIDE ER 10 MEQ PO TBCR: 10.0000 meq | EXTENDED_RELEASE_TABLET | Freq: Every day | ORAL | 0 refills | Status: DC

## 2018-07-20 NOTE — Telephone Encounter (Signed)
Referral faxed to [facility].

## 2018-07-20 NOTE — Telephone Encounter (Signed)
I want to get him set up for MRI lumbar spine at [facility] in [facility].  His symptoms include leg numbness, numbness in the genitalia and rectum, weakness and malaise, balance problem.  I also would like an x-ray of his right hip due to hip pain.  Please call [facility] and see how quickly they can get these 2 things done.  If it will be greater than 1 month then let scratch that idea and get him in with the neurologist for virtual visit ASAP.  He is living in [facility] but would prefer to go to a neurologist in [place] which is closer for him than coming to [place]  So I either want an MRI and hip x-ray or consult with neurologist within the next 2 weeks, sooner the better

## 2018-07-20 NOTE — Telephone Encounter (Signed)
[facility] is not doing MRI's at this point.  They are supposed to have a meeting on Jul 30, 2018 to see when they will open back up.    I have called [facility] and they have a referral form to fill out and fax to them, the doctor would review notes before scheduling appointment.

## 2018-07-20 NOTE — Telephone Encounter (Signed)
Go ahead and refer to  neurology ASAP preferably within 7-10 days, including numbness in legs, genitals, rectal area, ataxia, but no incontinence, symptoms x a few months.

## 2018-07-20 NOTE — Progress Notes (Signed)
Subjective:     Patient ID: Jonathan Mcmahon, male   DOB: 1970/10/27, 48 y.o.   MRN: [id number]  This visit type was conducted due to national recommendations for restrictions regarding the COVID-19 Pandemic (e.g. social distancing) in an effort to limit this patient's exposure and mitigate transmission in our community.  This format is felt to be most appropriate for this patient at this time.    Documentation for virtual audio and video telecommunications through Zoom encounter:  The patient was located at home. The provider was located in the office. The patient did consent to this visit and is aware of possible charges through their insurance for this visit.  The other persons participating in this telemedicine service were none. Time spent on call was 25 minutes and in review of previous records >30 minutes total.  This virtual service is not related to other E/M service within previous 7 days.   HPI Chief Complaint  Patient presents with  . leg/feet swelling    leg and feet swelling, weak, internal itch on both sides X 3 days   Virtual visit today for symptoms.  I saw him in 05/2018 for similar symptoms but he had not come in since 2017 before that, had been noncompliant with thyroid therapy.   He is a Naval architecttruck driver.   [place] which is where he is at today.   He has a history of abnormal thyroid function, enlarged prostate, impaired fasting glucose, erectile dysfunction, hx/o mild sleep apnea on prior sleep study, fatty liver disease, and is noncompliant with his medications per last visit.   He reports a few month history of having numbness and burning in feet, swelling in ankle and tops of feet, some in lower leg, coloration changes in both lower legs.  No recent injury, no trauma, no fall.  still has same symtpoms as last visit, no changes other than worse leg swelling bilat.   No calve pain.  He has fallen mowing grass, lost his balance.   Gets right hip  pain as well.   Both legs feel weak at times, right worse than left.    Can't stand for sheet to touch legs.   Sleeps in recliner.     Hypothyroidism -he has hypothyroidism and at last visit he started back on 50 mcg daily.  Since then saw Dr. Everardo AllEllison, endocrinology, bumped up to 100 mcg at this point.  Had labs last week that are reportedly stable although I do not have access to those labs.  Impaired fasting glucose-Denies polyuria or polydipsia.   At times urine stream seems weaker, has some hesitation.  Sometimes rectum feels numb after bowel movement.    Still had ED problems.  Problems getting and keeping erections.  No chest pain, no dyspnea.   Still haivng discomfort in size, right side of abdomen.  No fever.    Has had some blood in the urine, pending urology appointment end of this month.     Past Medical History:  Diagnosis Date  . Cyst of left kidney   . Disc disorder    "MRI bulging disc L4 and L5"  . Fatty liver   . Kidney stone    Current Outpatient Medications on File Prior to Visit  Medication Sig Dispense Refill  . levothyroxine (SYNTHROID, LEVOTHROID) 100 MCG tablet TAKE 1 TABLET BY MOUTH EVERY DAY 30 tablet 1   No current facility-administered medications on file prior to visit.     Review of  Systems As in subjective      Objective:   Physical Exam  Ht  (1.651 m)   Wt 208 lb (94.3 kg)   BMI 34.61 kg/m    Wt Readings from Last 3 Encounters:  07/20/18 208 lb (94.3 kg)  05/18/18 211 lb (95.7 kg)  05/18/18 210 lb 3.2 oz (95.3 kg)   Due to coronavirus pandemic stay at home measures, patient visit was virtual and they were not examined in person.   Gen: wd, wn, nad Legs with what appears to be 1+ nonpitting edema of bilat lower legs in general, no obvious discoloration Refer to last visit 05/18/2018 for more detailed exam     Assessment:     Encounter Diagnoses  Name Primary?  . Weakness of both lower extremities Yes  . Bilateral leg numbness    . Paresthesia   . Perineal numbness   . Thyroid disease   . Postablative hypothyroidism   . Impaired fasting blood sugar   . Other microscopic hematuria   . Abnormality of gait   . Ataxia   . Chronic back pain, unspecified back location, unspecified back pain laterality   . Erectile dysfunction, unspecified erectile dysfunction type   . Edema, unspecified type        Plan:     Weakness of both legs, bilateral leg numbness, ataxia, paresthesias in the genitals and rectal area- discussed with supervising physician.  Paresthesias not improved being back on thyroid medicine from where he had been noncompliant.  No obvious cauda equina as he still denies incontinence and is still able to apparently drive his truck.  His back pain is not as significant as the paresthesias.  I reviewed labs from last visit.  He is compliant with thyroid medication at this point.  Will refer to neurology for further eval and management.  He is living in [place] [facility] [place] now and would like to be referred to somewhere in [place] for neurology.  Thyroid disease, hypothyroidism-continue Synthroid 100 mcg daily, follow-up with endocrinology as planned  Microscopic hematuria-follow-up with urology as scheduled at the end of this month  Impaired glucose-reviewed labs from last visit, relatively stable.  Continue to work on healthy diet   Erectile dysfunction- may be related to the neurological symptoms above, await neurology consult  Chronic back pain- possible radiculopathy, possible bulging disc or other abnormality in the lumbar spine.  However given the other symptoms, refer to neurology  Edema - begin trial of HCTZ + potassium, call back 1 week.     Jonathan Mcmahon was seen today for leg/feet swelling.  Diagnoses and all orders for this visit:  Weakness of both lower extremities -     Ambulatory referral to Neurology  Bilateral leg numbness -     Ambulatory referral to Neurology  Paresthesia -      Ambulatory referral to Neurology  Perineal numbness -     Ambulatory referral to Neurology  Thyroid disease  Postablative hypothyroidism  Impaired fasting blood sugar  Other microscopic hematuria  Abnormality of gait -     Ambulatory referral to Neurology  Ataxia -     Ambulatory referral to Neurology  Chronic back pain, unspecified back location, unspecified back pain laterality -     Ambulatory referral to Neurology  Erectile dysfunction, unspecified erectile dysfunction type  Edema, unspecified type  Other orders -     hydrochlorothiazide (HYDRODIURIL) 25 MG tablet; Take 1 tablet (25 mg total) by mouth daily. -  potassium chloride (KLOR-CON 10) 10 MEQ tablet; Take 1 tablet (10 mEq total) by mouth daily.

## 2018-07-27 DIAGNOSIS — R531 Weakness: Secondary | ICD-10-CM | POA: Diagnosis not present

## 2018-07-27 DIAGNOSIS — R260 Ataxic gait: Secondary | ICD-10-CM | POA: Diagnosis not present

## 2018-07-27 DIAGNOSIS — N529 Male erectile dysfunction, unspecified: Secondary | ICD-10-CM | POA: Diagnosis not present

## 2018-07-31 DIAGNOSIS — M5136 Other intervertebral disc degeneration, lumbar region: Secondary | ICD-10-CM | POA: Diagnosis not present

## 2018-07-31 DIAGNOSIS — M5124 Other intervertebral disc displacement, thoracic region: Secondary | ICD-10-CM | POA: Diagnosis not present

## 2018-07-31 DIAGNOSIS — R260 Ataxic gait: Secondary | ICD-10-CM | POA: Diagnosis not present

## 2018-07-31 DIAGNOSIS — R531 Weakness: Secondary | ICD-10-CM | POA: Diagnosis not present

## 2018-07-31 DIAGNOSIS — N529 Male erectile dysfunction, unspecified: Secondary | ICD-10-CM | POA: Diagnosis not present

## 2018-07-31 DIAGNOSIS — M4807 Spinal stenosis, lumbosacral region: Secondary | ICD-10-CM | POA: Diagnosis not present

## 2018-07-31 DIAGNOSIS — M48061 Spinal stenosis, lumbar region without neurogenic claudication: Secondary | ICD-10-CM | POA: Diagnosis not present

## 2018-07-31 DIAGNOSIS — M5137 Other intervertebral disc degeneration, lumbosacral region: Secondary | ICD-10-CM | POA: Diagnosis not present

## 2018-07-31 DIAGNOSIS — R200 Anesthesia of skin: Secondary | ICD-10-CM | POA: Diagnosis not present

## 2018-08-04 ENCOUNTER — Other Ambulatory Visit: Payer: Self-pay | Admitting: Endocrinology

## 2018-08-10 DIAGNOSIS — G039 Meningitis, unspecified: Secondary | ICD-10-CM | POA: Diagnosis not present

## 2018-08-10 DIAGNOSIS — M549 Dorsalgia, unspecified: Secondary | ICD-10-CM | POA: Diagnosis not present

## 2018-08-10 DIAGNOSIS — M4804 Spinal stenosis, thoracic region: Secondary | ICD-10-CM | POA: Diagnosis not present

## 2018-08-11 ENCOUNTER — Other Ambulatory Visit: Payer: Self-pay | Admitting: Medical

## 2018-08-11 DIAGNOSIS — G959 Disease of spinal cord, unspecified: Secondary | ICD-10-CM | POA: Diagnosis not present

## 2018-08-17 DIAGNOSIS — Z79899 Other long term (current) drug therapy: Secondary | ICD-10-CM | POA: Diagnosis not present

## 2018-08-17 DIAGNOSIS — R05 Cough: Secondary | ICD-10-CM | POA: Diagnosis not present

## 2018-08-17 DIAGNOSIS — K5900 Constipation, unspecified: Secondary | ICD-10-CM | POA: Diagnosis not present

## 2018-08-17 DIAGNOSIS — G9589 Other specified diseases of spinal cord: Secondary | ICD-10-CM | POA: Diagnosis not present

## 2018-08-17 DIAGNOSIS — Q068 Other specified congenital malformations of spinal cord: Secondary | ICD-10-CM | POA: Diagnosis not present

## 2018-08-17 DIAGNOSIS — Z91041 Radiographic dye allergy status: Secondary | ICD-10-CM | POA: Diagnosis not present

## 2018-08-17 DIAGNOSIS — G9529 Other cord compression: Secondary | ICD-10-CM | POA: Diagnosis not present

## 2018-08-17 DIAGNOSIS — I10 Essential (primary) hypertension: Secondary | ICD-10-CM | POA: Diagnosis not present

## 2018-08-17 DIAGNOSIS — Z01818 Encounter for other preprocedural examination: Secondary | ICD-10-CM | POA: Diagnosis not present

## 2018-08-17 DIAGNOSIS — R55 Syncope and collapse: Secondary | ICD-10-CM | POA: Diagnosis not present

## 2018-08-17 DIAGNOSIS — E039 Hypothyroidism, unspecified: Secondary | ICD-10-CM | POA: Diagnosis not present

## 2018-08-17 DIAGNOSIS — G9519 Other vascular myelopathies: Secondary | ICD-10-CM | POA: Diagnosis not present

## 2018-08-17 DIAGNOSIS — I351 Nonrheumatic aortic (valve) insufficiency: Secondary | ICD-10-CM | POA: Diagnosis not present

## 2018-08-17 DIAGNOSIS — M4804 Spinal stenosis, thoracic region: Secondary | ICD-10-CM | POA: Diagnosis not present

## 2018-08-17 DIAGNOSIS — Z79890 Hormone replacement therapy (postmenopausal): Secondary | ICD-10-CM | POA: Diagnosis not present

## 2018-08-17 DIAGNOSIS — M5104 Intervertebral disc disorders with myelopathy, thoracic region: Secondary | ICD-10-CM | POA: Diagnosis not present

## 2018-08-17 DIAGNOSIS — G959 Disease of spinal cord, unspecified: Secondary | ICD-10-CM | POA: Diagnosis not present

## 2018-08-17 DIAGNOSIS — Z981 Arthrodesis status: Secondary | ICD-10-CM | POA: Diagnosis not present

## 2018-08-17 DIAGNOSIS — M5124 Other intervertebral disc displacement, thoracic region: Secondary | ICD-10-CM | POA: Diagnosis not present

## 2018-08-17 DIAGNOSIS — G992 Myelopathy in diseases classified elsewhere: Secondary | ICD-10-CM | POA: Diagnosis not present

## 2018-08-19 DIAGNOSIS — R55 Syncope and collapse: Secondary | ICD-10-CM | POA: Diagnosis not present

## 2018-08-19 DIAGNOSIS — R05 Cough: Secondary | ICD-10-CM | POA: Diagnosis not present

## 2018-08-19 DIAGNOSIS — Z981 Arthrodesis status: Secondary | ICD-10-CM | POA: Diagnosis not present

## 2018-08-20 DIAGNOSIS — I351 Nonrheumatic aortic (valve) insufficiency: Secondary | ICD-10-CM | POA: Diagnosis not present

## 2018-08-20 DIAGNOSIS — Z981 Arthrodesis status: Secondary | ICD-10-CM | POA: Diagnosis not present

## 2018-08-20 DIAGNOSIS — R55 Syncope and collapse: Secondary | ICD-10-CM | POA: Diagnosis not present

## 2018-08-20 DIAGNOSIS — R05 Cough: Secondary | ICD-10-CM | POA: Diagnosis not present

## 2018-08-21 DIAGNOSIS — R55 Syncope and collapse: Secondary | ICD-10-CM | POA: Diagnosis not present

## 2018-08-22 DIAGNOSIS — M545 Low back pain: Secondary | ICD-10-CM | POA: Diagnosis not present

## 2018-08-22 DIAGNOSIS — M4726 Other spondylosis with radiculopathy, lumbar region: Secondary | ICD-10-CM | POA: Diagnosis not present

## 2018-08-22 DIAGNOSIS — M4316 Spondylolisthesis, lumbar region: Secondary | ICD-10-CM | POA: Diagnosis not present

## 2018-08-22 DIAGNOSIS — M4804 Spinal stenosis, thoracic region: Secondary | ICD-10-CM | POA: Diagnosis not present

## 2018-09-01 DIAGNOSIS — Z981 Arthrodesis status: Secondary | ICD-10-CM | POA: Diagnosis not present

## 2018-10-01 ENCOUNTER — Other Ambulatory Visit: Payer: Self-pay | Admitting: Endocrinology

## 2018-10-05 DIAGNOSIS — Z981 Arthrodesis status: Secondary | ICD-10-CM | POA: Diagnosis not present

## 2018-10-11 DIAGNOSIS — M545 Low back pain: Secondary | ICD-10-CM | POA: Diagnosis not present

## 2018-10-11 DIAGNOSIS — R531 Weakness: Secondary | ICD-10-CM | POA: Diagnosis not present

## 2018-10-11 DIAGNOSIS — R262 Difficulty in walking, not elsewhere classified: Secondary | ICD-10-CM | POA: Diagnosis not present

## 2018-10-13 DIAGNOSIS — M545 Low back pain: Secondary | ICD-10-CM | POA: Diagnosis not present

## 2018-10-13 DIAGNOSIS — R531 Weakness: Secondary | ICD-10-CM | POA: Diagnosis not present

## 2018-10-13 DIAGNOSIS — R262 Difficulty in walking, not elsewhere classified: Secondary | ICD-10-CM | POA: Diagnosis not present

## 2018-10-15 DIAGNOSIS — R531 Weakness: Secondary | ICD-10-CM | POA: Diagnosis not present

## 2018-10-15 DIAGNOSIS — R262 Difficulty in walking, not elsewhere classified: Secondary | ICD-10-CM | POA: Diagnosis not present

## 2018-10-15 DIAGNOSIS — M545 Low back pain: Secondary | ICD-10-CM | POA: Diagnosis not present

## 2018-10-18 DIAGNOSIS — R531 Weakness: Secondary | ICD-10-CM | POA: Diagnosis not present

## 2018-10-18 DIAGNOSIS — M545 Low back pain: Secondary | ICD-10-CM | POA: Diagnosis not present

## 2018-10-18 DIAGNOSIS — R262 Difficulty in walking, not elsewhere classified: Secondary | ICD-10-CM | POA: Diagnosis not present

## 2018-10-20 DIAGNOSIS — M545 Low back pain: Secondary | ICD-10-CM | POA: Diagnosis not present

## 2018-10-20 DIAGNOSIS — R531 Weakness: Secondary | ICD-10-CM | POA: Diagnosis not present

## 2018-10-20 DIAGNOSIS — R262 Difficulty in walking, not elsewhere classified: Secondary | ICD-10-CM | POA: Diagnosis not present

## 2018-10-22 DIAGNOSIS — R531 Weakness: Secondary | ICD-10-CM | POA: Diagnosis not present

## 2018-10-22 DIAGNOSIS — R262 Difficulty in walking, not elsewhere classified: Secondary | ICD-10-CM | POA: Diagnosis not present

## 2018-10-22 DIAGNOSIS — M545 Low back pain: Secondary | ICD-10-CM | POA: Diagnosis not present

## 2018-10-25 ENCOUNTER — Other Ambulatory Visit: Payer: Self-pay | Admitting: Endocrinology

## 2018-10-25 DIAGNOSIS — H524 Presbyopia: Secondary | ICD-10-CM | POA: Diagnosis not present

## 2018-10-27 DIAGNOSIS — M545 Low back pain: Secondary | ICD-10-CM | POA: Diagnosis not present

## 2018-10-27 DIAGNOSIS — R262 Difficulty in walking, not elsewhere classified: Secondary | ICD-10-CM | POA: Diagnosis not present

## 2018-10-27 DIAGNOSIS — R531 Weakness: Secondary | ICD-10-CM | POA: Diagnosis not present

## 2018-10-29 DIAGNOSIS — M545 Low back pain: Secondary | ICD-10-CM | POA: Diagnosis not present

## 2018-10-29 DIAGNOSIS — R531 Weakness: Secondary | ICD-10-CM | POA: Diagnosis not present

## 2018-10-29 DIAGNOSIS — R262 Difficulty in walking, not elsewhere classified: Secondary | ICD-10-CM | POA: Diagnosis not present

## 2018-11-19 ENCOUNTER — Other Ambulatory Visit: Payer: Self-pay | Admitting: Endocrinology

## 2018-12-14 ENCOUNTER — Telehealth: Payer: Self-pay

## 2018-12-14 ENCOUNTER — Other Ambulatory Visit: Payer: Self-pay | Admitting: Endocrinology

## 2018-12-14 NOTE — Telephone Encounter (Signed)
LMTCB and schedule

## 2018-12-14 NOTE — Telephone Encounter (Signed)
Per Dr. Ellison, unable to refill Levothyroxine without an appt. Routing this message to the front desk for scheduling purposes.  

## 2018-12-17 ENCOUNTER — Other Ambulatory Visit: Payer: Self-pay

## 2018-12-17 DIAGNOSIS — E890 Postprocedural hypothyroidism: Secondary | ICD-10-CM

## 2018-12-17 MED ORDER — LEVOTHYROXINE SODIUM 100 MCG PO TABS: 100.0000 ug | ORAL_TABLET | Freq: Every day | ORAL | 0 refills | Status: DC

## 2018-12-17 NOTE — Telephone Encounter (Signed)
Patient is scheduled for an appointment on 12/31/18 at 3:45 p.m.

## 2018-12-17 NOTE — Telephone Encounter (Signed)
levothyroxine (SYNTHROID) 100 MCG tablet 30 tablet 0 12/17/2018    Sig - Route: Take 1 tablet (100 mcg total) by mouth daily. - Oral   Sent to pharmacy as: levothyroxine (SYNTHROID) 100 MCG tablet   E-Prescribing Status: Receipt confirmed by pharmacy (12/17/2018 12:28 PM EDT)

## 2018-12-31 ENCOUNTER — Ambulatory Visit: Payer: BLUE CROSS/BLUE SHIELD | Admitting: Endocrinology

## 2019-01-07 ENCOUNTER — Telehealth: Payer: Self-pay | Admitting: Medical

## 2019-01-07 NOTE — Telephone Encounter (Signed)
Please let him know that it has been a pleasure to see him.  Unfortunately I do not know of any medical providers out that way.   He may want to call his insurance company to get some provider recommendations based on their internal data, provider scores, etc.

## 2019-01-07 NOTE — Telephone Encounter (Signed)
Called and left pt a VM.

## 2019-01-07 NOTE — Telephone Encounter (Signed)
Pt called and stated that he moved and wanted to know if it was any drs in the [place] area you could recommend to him

## 2019-01-10 ENCOUNTER — Other Ambulatory Visit: Payer: Self-pay | Admitting: Endocrinology

## 2019-01-10 DIAGNOSIS — E890 Postprocedural hypothyroidism: Secondary | ICD-10-CM

## 2019-01-24 ENCOUNTER — Other Ambulatory Visit: Payer: Self-pay | Admitting: Endocrinology

## 2019-01-24 DIAGNOSIS — E890 Postprocedural hypothyroidism: Secondary | ICD-10-CM

## 2019-02-04 ENCOUNTER — Ambulatory Visit: Payer: BLUE CROSS/BLUE SHIELD | Admitting: Endocrinology

## 2019-02-22 ENCOUNTER — Other Ambulatory Visit: Payer: Self-pay

## 2019-02-22 ENCOUNTER — Telehealth: Payer: Self-pay | Admitting: Endocrinology

## 2019-02-22 DIAGNOSIS — E890 Postprocedural hypothyroidism: Secondary | ICD-10-CM

## 2019-02-22 MED ORDER — LEVOTHYROXINE SODIUM 100 MCG PO TABS: 100.0000 ug | ORAL_TABLET | Freq: Every day | ORAL | 0 refills | Status: DC

## 2019-02-22 NOTE — Telephone Encounter (Signed)
Patient wanted to advise Dr Loanne Drilling that he had spinal fusion in May 2020 at [facility]

## 2019-02-22 NOTE — Telephone Encounter (Signed)
MEDICATION: Levothyroxine  PHARMACY:  [facility] [place], [place] [facility]   IS PATIENT OUT OF MEDICATION:   IF NOT; HOW MUCH IS LEFT: 5-7 days  LAST APPOINTMENT DATE: @10 /26/2020  NEXT APPOINTMENT DATE:@12 /09/2018  DO WE HAVE YOUR PERMISSION TO LEAVE A DETAILED MESSAGE: yes  OTHER COMMENTS:    **Let patient know to contact pharmacy at the end of the day to make sure medication is ready. **  ** Please notify patient to allow 48-72 hours to process**  **Encourage patient to contact the pharmacy for refills or they can request refills through [facility]**

## 2019-02-22 NOTE — Telephone Encounter (Signed)
levothyroxine (SYNTHROID) 100 MCG tablet 30 tablet 0 02/22/2019    Sig - Route: Take 1 tablet (100 mcg total) by mouth daily. - Oral   Sent to pharmacy as: levothyroxine (SYNTHROID) 100 MCG tablet   E-Prescribing Status: Receipt confirmed by pharmacy (02/22/2019 2:51 PM EST)

## 2019-03-07 ENCOUNTER — Encounter: Payer: Self-pay | Admitting: Endocrinology

## 2019-03-07 ENCOUNTER — Ambulatory Visit: Payer: BLUE CROSS/BLUE SHIELD | Admitting: Endocrinology

## 2019-03-07 ENCOUNTER — Ambulatory Visit (INDEPENDENT_AMBULATORY_CARE_PROVIDER_SITE_OTHER): Payer: Self-pay | Admitting: Endocrinology

## 2019-03-07 ENCOUNTER — Other Ambulatory Visit: Payer: Self-pay

## 2019-03-07 VITALS — BP 120/72 | HR 76 | Ht 65.0 in | Wt 210.8 lb

## 2019-03-07 DIAGNOSIS — E890 Postprocedural hypothyroidism: Secondary | ICD-10-CM

## 2019-03-07 LAB — TSH: TSH: 3.07 u[IU]/mL (ref 0.35–4.50)

## 2019-03-07 LAB — T4, FREE: Free T4: 0.9 ng/dL (ref 0.60–1.60)

## 2019-03-07 NOTE — Progress Notes (Signed)
Subjective:    Patient ID: Jonathan Mcmahon, male    DOB: 1970-11-13, 48 y.o.   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  Pt says he takes synthroid 100 mcg/day, as rx'ed.  He has slight numbness of the feet, but no assoc pain.  He recently had back surgery.   Past Medical History:  Diagnosis Date  . Cyst of left kidney   . Disc disorder    "MRI bulging disc L4 and L5"  . Fatty liver   . Kidney stone     Past Surgical History:  Procedure Laterality Date  . APPENDECTOMY      Social History   Socioeconomic History  . Marital status: Married    Spouse name: Not on file  . Number of children: 3  . Years of education: 52  . Highest education level: Not on file  Occupational History  . Occupation: Truck Development worker, international aid  . Financial resource strain: Not on file  . Food insecurity    Worry: Not on file    Inability: Not on file  . Transportation needs    Medical: Not on file    Non-medical: Not on file  Tobacco Use  . Smoking status: Never Smoker  . Smokeless tobacco: Never Used  Substance and Sexual Activity  . Alcohol use: No    Alcohol/week: 0.0 standard drinks  . Drug use: No  . Sexual activity: Not on file  Lifestyle  . Physical activity    Days per week: Not on file    Minutes per session: Not on file  . Stress: Not on file  Relationships  . Social Herbalist on phone: Not on file    Gets together: Not on file    Attends religious service: Not on file    Active member of club or organization: Not on file    Attends meetings of clubs or organizations: Not on file    Relationship status: Not on file  . Intimate partner violence    Fear of current or ex partner: Not on file    Emotionally abused: Not on file    Physically  abused: Not on file    Forced sexual activity: Not on file  Other Topics Concern  . Not on file  Social History Narrative   Walks for exercise some.  Recent changed eating habits to more healthy.  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     Drinks about a couple cups of tea a week     Current Outpatient Medications on File Prior to Visit  Medication Sig Dispense Refill  . hydrochlorothiazide (HYDRODIURIL) 25 MG tablet TAKE 1 TABLET BY MOUTH EVERY DAY 30 tablet 0  . levothyroxine (SYNTHROID) 100 MCG tablet Take 1 tablet (100 mcg total) by mouth daily. 30 tablet 0  . potassium chloride (K-DUR) 10 MEQ tablet TAKE 1 TABLET BY MOUTH EVERY DAY 30 tablet 0   No current facility-administered medications on file prior to visit.     No Known Allergies  Family History  Problem Relation Age of Onset  . Renal cancer Mother   . CAD Father   . Heart disease Father 17  died of MI  . Diabetes Father   . Gallbladder disease Neg Hx   . Thyroid disease Neg Hx     BP 120/72 (BP Location: Left Arm, Patient Position: Sitting, Cuff Size: Large)   Pulse 76   Ht 5\' 5"  (1.651 m)   Wt 210 lb 12.8 oz (95.6 kg)   SpO2 98%   BMI 35.08 kg/m    Review of Systems Denies neck swelling and pain    Objective:   Physical Exam VITAL SIGNS:  See vs page GENERAL: no distress NECK: There is no palpable thyroid enlargement.  No thyroid nodule is palpable.  No palpable lymphadenopathy at the anterior neck.    Lab Results  Component Value Date   TSH 3.07 03/07/2019   T3TOTAL 96 05/18/2018      Assessment & Plan:  Hypothyroidism: well-replaced Numbness: not thyroid-related  Patient Instructions  Blood tests are requested for you today.  We'll let you know about the results.  It is best to never miss the medication.  However, if you do miss it, next best is to double up the next time.   Please come back for a follow-up appointment in 6 months.

## 2019-03-07 NOTE — Patient Instructions (Signed)
Blood tests are requested for you today.  We'll let you know about the results.  It is best to never miss the medication.  However, if you do miss it, next best is to double up the next time.   Please come back for a follow-up appointment in 6 months.   

## 2019-03-08 ENCOUNTER — Telehealth: Payer: Self-pay

## 2019-03-08 ENCOUNTER — Telehealth: Payer: Self-pay | Admitting: Medical

## 2019-03-08 NOTE — Telephone Encounter (Signed)
Pt left a vm wanting to see if he could get a refill on the fluid pill that was prescribed to him at his last visit. Called pt back to confirm pharmacy but no answer

## 2019-03-08 NOTE — Telephone Encounter (Signed)
-----   Message from Renato Shin, MD sent at 03/07/2019  5:49 PM EST ----- please contact patient: Normal.  Please continue the same medication. I'll see you next time.

## 2019-03-08 NOTE — Telephone Encounter (Signed)
Due for f/u visit?  But I thought he moved to [facility]?   If he did move, has he not established with PCP there?  He had several concerns/issues last visit that needed follow up?

## 2019-03-08 NOTE — Telephone Encounter (Signed)
Lab results reviewed by Dr. Ellison. Letter has been mailed. For future reference, letter can be found in Epic. 

## 2019-03-09 NOTE — Telephone Encounter (Signed)
Patient has been scheduled for Monday. He has an appointment scheduled for Monday. He can't be seen in [facility] [place] until February.

## 2019-03-14 ENCOUNTER — Other Ambulatory Visit: Payer: Self-pay

## 2019-03-14 ENCOUNTER — Encounter: Payer: Self-pay | Admitting: Medical

## 2019-03-14 ENCOUNTER — Ambulatory Visit (INDEPENDENT_AMBULATORY_CARE_PROVIDER_SITE_OTHER): Payer: Self-pay | Admitting: Medical

## 2019-03-14 VITALS — BP 136/78 | HR 77 | Temp 97.6°F | Ht 65.0 in | Wt 210.4 lb

## 2019-03-14 DIAGNOSIS — R29898 Other symptoms and signs involving the musculoskeletal system: Secondary | ICD-10-CM

## 2019-03-14 DIAGNOSIS — R748 Abnormal levels of other serum enzymes: Secondary | ICD-10-CM

## 2019-03-14 DIAGNOSIS — L989 Disorder of the skin and subcutaneous tissue, unspecified: Secondary | ICD-10-CM

## 2019-03-14 DIAGNOSIS — Z1159 Encounter for screening for other viral diseases: Secondary | ICD-10-CM | POA: Insufficient documentation

## 2019-03-14 DIAGNOSIS — R6882 Decreased libido: Secondary | ICD-10-CM

## 2019-03-14 DIAGNOSIS — R5383 Other fatigue: Secondary | ICD-10-CM

## 2019-03-14 DIAGNOSIS — K760 Fatty (change of) liver, not elsewhere classified: Secondary | ICD-10-CM | POA: Insufficient documentation

## 2019-03-14 DIAGNOSIS — R200 Anesthesia of skin: Secondary | ICD-10-CM

## 2019-03-14 DIAGNOSIS — R1011 Right upper quadrant pain: Secondary | ICD-10-CM

## 2019-03-14 DIAGNOSIS — R7301 Impaired fasting glucose: Secondary | ICD-10-CM

## 2019-03-14 DIAGNOSIS — R21 Rash and other nonspecific skin eruption: Secondary | ICD-10-CM

## 2019-03-14 DIAGNOSIS — R609 Edema, unspecified: Secondary | ICD-10-CM

## 2019-03-14 NOTE — Progress Notes (Signed)
Subjective: Chief Complaint  Patient presents with  . Follow-up   Here for f/u.  When I last spoke to him back in April 2020 he had several concerns.  Since that visit he had thoracic back surgery for upper back pain, herniated disc, but says at that time he also had scans of his entire spine.  Still has numbness in feet since 05/2018 illness.  He ended up seeing back surgeon in neurology earlier this year.  He is not sure if he had nerve conduction studies of his legs.  He does not recall what the lumbar spine imaging showed.  He continues to note right lower extremity weakness, numbness in bilateral lower extremities particularly below the knees, at times decreased sensation in the genitals.  He has history of edema, ran out of Hydrochlorothiazide 25mg  and potassium 10 daily.  Those medications did help and he would like a refill  Having fullness in right upper belly.  He sometimes notes pain after eating.  He developed a darkish brown coloration over the right upper quadrant months ago.  He has a new skin lesion on his right flank his wife noticed a few days ago.  One of his doctors in [place], [place] is Dr. [place]   Past Medical History:  Diagnosis Date  . Cyst of left kidney   . Disc disorder    "MRI bulging disc L4 and L5"  . Fatty liver   . Kidney stone    Current Outpatient Medications on File Prior to Visit  Medication Sig Dispense Refill  . levothyroxine (SYNTHROID) 100 MCG tablet Take 1 tablet (100 mcg total) by mouth daily. 30 tablet 0  . hydrochlorothiazide (HYDRODIURIL) 25 MG tablet TAKE 1 TABLET BY MOUTH EVERY DAY (Patient not taking: Reported on 03/14/2019) 30 tablet 0  . potassium chloride (K-DUR) 10 MEQ tablet TAKE 1 TABLET BY MOUTH EVERY DAY (Patient not taking: Reported on 03/14/2019) 30 tablet 0   No current facility-administered medications on file prior to visit.   Review of systems as in subjective otherwise  Objective: BP 136/78   Pulse 77   Temp 97.6  F (36.4 C)   Ht 5\' 5"  (1.651 m)   Wt 210 lb 6.4 oz (95.4 kg)   SpO2 98%   BMI 35.01 kg/m   Wt Readings from Last 3 Encounters:  03/14/19 210 lb 6.4 oz (95.4 kg)  03/07/19 210 lb 12.8 oz (95.6 kg)  07/20/18 208 lb (94.3 kg)   General appearance: alert, no distress, WD/WN, obese AA male Skin: right mid posterolateral chest wall with 1.4 cm diameter raised bullous lesion, abdomen RUQ with brownish round 4 cm  Patch of brownish coloration Neck: supple, no lymphadenopathy, no thyromegaly, no masses Heart: RRR, normal S1, S2, no murmurs Lungs: CTA bilaterally, no wheezes, rhonchi, or rales Abdomen: +bs, soft, right UQ tendnerss in general, surgical scar RLQ, otherwise  non tender, non distended, no masses, no hepatomegaly, no splenomegaly Pulses: 2+ symmetric, upper and lower extremities, normal cap refill No edema Thoracic spine surgical scar present Legs nontender to touch, no swelling or deformity, right leg strength mildly decreased compared to left    Assessment: Encounter Diagnoses  Name Primary?  . Fatty liver Yes  . Low libido   . RUQ pain   . Rash   . Weakness of right lower extremity   . Lower extremity numbness   . Edema, unspecified type   . Elevated liver enzymes   . Encounter for hepatitis C screening test  for low risk patient   . Impaired fasting glucose   . Skin lesion   . Fatigue, unspecified type     Plan: He has several issues today  Fatty liver disease-we discussed the ultrasound results from 2016 showing fatty liver disease on ultrasound, discussed the need to lose weight through healthy diet and exercise.  He has actually gained weight since 2016.  We discussed long-term complications of fatty liver disease  No libido, fatigue-testosterone level today  Right upper quadrant pain-we discussed possible causes of his symptoms.  Consider repeat imaging, other labs.     Elevated LFTs - screen for hepatitis today  Weakness of RLE and numbness in legs -  discussed possible causes including lumbar radiculopathy, nerve damage, metabolic derangement, tumor or other.   He does have hypothyroidism on medication for this.   We will request prior copy of L spine MRI and EMG/NCS if these were done.  Request records through neurology.   advised he f/u with neurology  Edema - refill medications for prn use  Rash on abdomen - unclear etiology.  F/u pending labs  Skin lesion right flank/right posterior lateral chest wall and right abdominal rash, consider shingles, dermatitis, other individual lesions.     Consider dermatology consult pending labs   Charvez was seen today for follow-up.  Diagnoses and all orders for this visit:  Fatty liver -     Hemoglobin A1c -     Hepatitis C antibody -     Hepatitis B surface antigen  Low libido -     Testosterone  RUQ pain -     Hepatitis C antibody -     Hepatitis B surface antigen  Rash -     Hemoglobin A1c  Weakness of right lower extremity -     Hemoglobin A1c  Lower extremity numbness  Edema, unspecified type  Elevated liver enzymes -     Hepatitis C antibody -     Hepatitis B surface antigen  Encounter for hepatitis C screening test for low risk patient -     Hepatitis C antibody -     Hepatitis B surface antigen  Impaired fasting glucose  Skin lesion  Fatigue, unspecified type

## 2019-03-15 LAB — HEPATITIS C ANTIBODY: Hep C Virus Ab: <0.1 s/co ratio (ref 0.0–0.9)

## 2019-03-15 LAB — HEPATITIS B SURFACE ANTIGEN: Hepatitis B Surface Ag: NEGATIVE

## 2019-03-15 LAB — HEMOGLOBIN A1C
Est. average glucose Bld gHb Est-mCnc: 126 mg/dL
Hgb A1c MFr Bld: 6 % — ABNORMAL HIGH (ref 4.8–5.6)

## 2019-03-15 LAB — TESTOSTERONE: Testosterone: 216 ng/dL — ABNORMAL LOW (ref 264–916)

## 2019-03-16 ENCOUNTER — Other Ambulatory Visit: Payer: Self-pay

## 2019-03-16 MED ORDER — HYDROCHLOROTHIAZIDE 25 MG PO TABS: 25.0000 mg | ORAL_TABLET | Freq: Every day | ORAL | 0 refills | Status: DC

## 2019-03-25 ENCOUNTER — Other Ambulatory Visit: Payer: Self-pay | Admitting: Endocrinology

## 2019-03-25 DIAGNOSIS — E890 Postprocedural hypothyroidism: Secondary | ICD-10-CM

## 2019-04-26 ENCOUNTER — Other Ambulatory Visit: Payer: Self-pay | Admitting: Endocrinology

## 2019-04-26 DIAGNOSIS — E890 Postprocedural hypothyroidism: Secondary | ICD-10-CM

## 2019-05-19 ENCOUNTER — Other Ambulatory Visit: Payer: Self-pay | Admitting: Endocrinology

## 2019-05-19 DIAGNOSIS — E890 Postprocedural hypothyroidism: Secondary | ICD-10-CM

## 2019-06-09 ENCOUNTER — Other Ambulatory Visit: Payer: Self-pay | Admitting: Medical

## 2019-06-14 ENCOUNTER — Other Ambulatory Visit: Payer: Self-pay | Admitting: Endocrinology

## 2019-06-14 DIAGNOSIS — E890 Postprocedural hypothyroidism: Secondary | ICD-10-CM

## 2019-07-08 ENCOUNTER — Other Ambulatory Visit: Payer: Self-pay | Admitting: Endocrinology

## 2019-07-08 DIAGNOSIS — E890 Postprocedural hypothyroidism: Secondary | ICD-10-CM

## 2019-08-11 ENCOUNTER — Other Ambulatory Visit: Payer: Self-pay | Admitting: Endocrinology

## 2019-08-11 DIAGNOSIS — E890 Postprocedural hypothyroidism: Secondary | ICD-10-CM

## 2019-09-05 ENCOUNTER — Other Ambulatory Visit: Payer: Self-pay | Admitting: Endocrinology

## 2019-09-05 DIAGNOSIS — E890 Postprocedural hypothyroidism: Secondary | ICD-10-CM

## 2019-09-19 ENCOUNTER — Ambulatory Visit: Payer: BLUE CROSS/BLUE SHIELD | Admitting: Endocrinology

## 2019-10-10 ENCOUNTER — Other Ambulatory Visit: Payer: Self-pay | Admitting: Endocrinology

## 2019-10-10 DIAGNOSIS — E890 Postprocedural hypothyroidism: Secondary | ICD-10-CM

## 2019-10-17 ENCOUNTER — Ambulatory Visit: Payer: Self-pay | Admitting: Endocrinology

## 2019-10-31 DIAGNOSIS — Z0289 Encounter for other administrative examinations: Secondary | ICD-10-CM

## 2019-11-08 ENCOUNTER — Other Ambulatory Visit: Payer: Self-pay | Admitting: Endocrinology

## 2019-11-08 DIAGNOSIS — E890 Postprocedural hypothyroidism: Secondary | ICD-10-CM

## 2019-11-28 ENCOUNTER — Telehealth: Payer: Self-pay

## 2019-11-28 ENCOUNTER — Other Ambulatory Visit: Payer: Self-pay

## 2019-11-28 ENCOUNTER — Encounter: Payer: Self-pay | Admitting: Endocrinology

## 2019-11-28 ENCOUNTER — Ambulatory Visit (INDEPENDENT_AMBULATORY_CARE_PROVIDER_SITE_OTHER): Payer: Self-pay | Admitting: Endocrinology

## 2019-11-28 VITALS — BP 100/60 | HR 71 | Ht 65.0 in | Wt 197.2 lb

## 2019-11-28 DIAGNOSIS — E890 Postprocedural hypothyroidism: Secondary | ICD-10-CM

## 2019-11-28 LAB — TSH: TSH: 0.87 u[IU]/mL (ref 0.35–4.50)

## 2019-11-28 LAB — T4, FREE: Free T4: 1.05 ng/dL (ref 0.60–1.60)

## 2019-11-28 NOTE — Patient Instructions (Signed)
Blood tests are requested for you today.  We'll let you know about the results.  It is best to never miss the medication.  However, if you do miss it, next best is to double up the next time.   Please come back for a follow-up appointment in 6 months.   

## 2019-11-28 NOTE — Telephone Encounter (Signed)
RESULTS  Results were reviewed by Dr. Ellison. A letter has been mailed to pt home address. For future reference, letter can be found in Epic. 

## 2019-11-28 NOTE — Progress Notes (Signed)
Subjective:    Patient ID: Jonathan Mcmahon, male    DOB: 1970-06-19, 49 y.o.   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  Pt says he takes synthroid 100 mcg/day, as rx'ed.   Past Medical History:  Diagnosis Date   Cyst of left kidney    Disc disorder    "MRI bulging disc L4 and L5"   Fatty liver    Kidney stone     Past Surgical History:  Procedure Laterality Date   APPENDECTOMY      Social History   Socioeconomic History   Marital status: Married    Spouse name: Not on file   Number of children: 3   Years of education: 12   Highest education level: Not on file  Occupational History   Occupation: Truck [employer] doctor   Tobacco Use   Smoking status: Never Smoker   Smokeless tobacco: Never Used  Substance and Sexual Activity   Alcohol use: No    Alcohol/week: 0.0 standard drinks   Drug use: No   Sexual activity: Not on file  Other Topics Concern   Not on file  Social History Narrative   Walks for exercise some.  Recent changed eating habits to more healthy.  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     Drinks about a couple cups of tea a week    Social Determinants of Corporate investment banker Strain:    Difficulty of Paying Living Expenses: Not on file  Food Insecurity:    Worried About Programme researcher, broadcasting/film/video in the Last Year: Not on file   [employer] of Food in the Last Year: Not on file  Transportation Needs:    Lack of Transportation (Medical): Not on file   Lack of Transportation (Non-Medical): Not on file  Physical Activity:    Days of Exercise per Week: Not on file   Minutes of Exercise per Session: Not on file  Stress:    Feeling of Stress : Not on file  Social Connections:    Frequency of  Communication with Friends and Family: Not on file   Frequency of Social Gatherings with Friends and Family: Not on file   Attends Religious Services: Not on file   Active Member of Clubs or Organizations: Not on file   Attends Banker Meetings: Not on file   Marital Status: Not on file  Intimate Partner Violence:    Fear of Current or Ex-Partner: Not on file   Emotionally Abused: Not on file   Physically Abused: Not on file   Sexually Abused: Not on file    Current Outpatient Medications on File Prior to Visit  Medication Sig Dispense Refill   hydrochlorothiazide (HYDRODIURIL) 25 MG tablet TAKE 1 TABLET BY MOUTH EVERY DAY 90 tablet 0   levothyroxine (SYNTHROID) 100 MCG tablet TAKE 1 TABLET BY MOUTH EVERY DAY 30 tablet 0   potassium chloride (K-DUR) 10 MEQ tablet TAKE 1 TABLET BY MOUTH EVERY DAY 30 tablet 0   No current facility-administered medications on file prior to visit.    No Known Allergies  Family History  Problem Relation Age of Onset   Renal cancer Mother    CAD Father  Heart disease Father 55       died of MI   Diabetes Father    Gallbladder disease Neg Hx    Thyroid disease Neg Hx     BP 100/60    Pulse 71    Ht 5\' 5"  (1.651 m)    Wt 197 lb 3.2 oz (89.4 kg)    SpO2 98%    BMI 32.82 kg/m    Review of Systems     Objective:   Physical Exam VITAL SIGNS:  See vs page GENERAL: no distress NECK: There is no palpable thyroid enlargement.  No thyroid nodule is palpable.  No palpable lymphadenopathy at the anterior neck.   Lab Results  Component Value Date   TSH 0.87 11/28/2019   T3TOTAL 96 05/18/2018      Assessment & Plan:  Hypothyroidism: well-replaced.  Please continue the same medication

## 2019-11-28 NOTE — Telephone Encounter (Signed)
-----   Message from Romero Belling, MD sent at 11/28/2019  4:41 PM EDT ----- please contact patient: Normal.  Please continue the same medication.  I'll see you next time.

## 2019-12-13 ENCOUNTER — Other Ambulatory Visit: Payer: Self-pay | Admitting: Endocrinology

## 2019-12-13 DIAGNOSIS — E890 Postprocedural hypothyroidism: Secondary | ICD-10-CM

## 2020-04-19 ENCOUNTER — Other Ambulatory Visit: Payer: Self-pay

## 2020-04-19 ENCOUNTER — Telehealth (INDEPENDENT_AMBULATORY_CARE_PROVIDER_SITE_OTHER): Payer: HRSA Program | Admitting: Medical

## 2020-04-19 ENCOUNTER — Encounter: Payer: Self-pay | Admitting: Medical

## 2020-04-19 VITALS — BP 126/78 | Ht 64.0 in | Wt 197.0 lb

## 2020-04-19 DIAGNOSIS — R6883 Chills (without fever): Secondary | ICD-10-CM | POA: Diagnosis not present

## 2020-04-19 DIAGNOSIS — R059 Cough, unspecified: Secondary | ICD-10-CM

## 2020-04-19 DIAGNOSIS — U071 COVID-19: Secondary | ICD-10-CM | POA: Diagnosis not present

## 2020-04-19 MED ORDER — PROMETHAZINE-DM 6.25-15 MG/5ML PO SYRP: 5.0000 mL | ORAL_SOLUTION | Freq: Four times a day (QID) | ORAL | 0 refills | Status: AC | PRN

## 2020-04-19 MED ORDER — EMERGEN-C IMMUNE PLUS PO PACK: 1.0000 | PACK | Freq: Two times a day (BID) | ORAL | 0 refills | Status: AC

## 2020-04-19 NOTE — Progress Notes (Signed)
Subjective:     Patient ID: Jonathan Mcmahon, male   DOB: 31-May-1970, 50 y.o.   MRN: [id number]  This visit type was conducted due to national recommendations for restrictions regarding the COVID-19 Pandemic (e.g. social distancing) in an effort to limit this patient's exposure and mitigate transmission in our community.  Due to their co-morbid illnesses, this patient is at least at moderate risk for complications without adequate follow up.  This format is felt to be most appropriate for this patient at this time.    Documentation for virtual audio and video telecommunications through [place] encounter:  The patient was located at home. The provider was located in the office. The patient did consent to this visit and is aware of possible charges through their insurance for this visit.  The other persons participating in this telemedicine service were none. Time spent on call was 20 minutes and in review of previous records 20 minutes total.  This virtual service is not related to other E/M service within previous 7 days.   HPI Chief Complaint  Patient presents with  . Covid Positive    Tested positive 04/16/20. Symptoms started 04/13/20. Still have cough, congestion, sinus pressure. Have not had covid vaccines    Virtual consult for illness.  Currently   He notes oingoing cough, congestion, sinus pressure.  Feels like the worst is over.  Had some chills, but no fever.  Had some sweats.   Has had some vomiting but not in last 24 hours.   +nausea.  No diarrhea.   No SOB or wheezing.  Gets some headache with cough, but mild.  Using all in 1 mucinex, theraflu in tea, using hot tea honey and lemon.  Using steam.  Is 50% improved.    Risk factors include obesity, fatty liver disease and no hx/o vaccine.   Fluid intake is good.    No prior covid vaccines.    Review of Systems As in subjective    Objective:   Physical Exam Due to coronavirus pandemic stay at home measures, patient visit  was virtual and they were not examined in person.   BP 126/78   Ht 5\' 4"  (1.626 m)   Wt 197 lb (89.4 kg)   BMI 33.81 kg/m   Wt Readings from Last 3 Encounters:  04/19/20 197 lb (89.4 kg)  11/28/19 197 lb 3.2 oz (89.4 kg)  03/14/19 210 lb 6.4 oz (95.4 kg)   Gen: wd, wn, nad, African-American male, mildly ill-appearing No obvious wheezing or shortness of breath Answers questions in complete sentences     Assessment:     Encounter Diagnoses  Name Primary?  . COVID-19 virus infection Yes  . Cough   . Chills        Plan:     We discussed his symptoms and concerns.  Overall seems to be on the mild side of illness and improving.  He has not had the vaccine.  We discussed the potential for monoclonal antibody treatment.  He is on day 5.  He declines referral for infusion therapy at this time.  We discussed the following recommendations and discussed follow-up if not improving or worse over the next few days  Risk factors include obesity, fatty liver disease.  He has not had the vaccine   Patient Instructions  [facility] [phone]  General recommendations if you have respiratory symptoms:  We recommend you rest, hydrate well with water and clear fluids throughout the day such as water, soup broth, ice  chips, or possibly pedialyte or G2 no sugar gatorade.    You can use Tylenol over the counter for pain or fever every 4 - 6 hours  Begin the Promethazine DM cough syrup as sent to the pharmacy for cough and nausea   you can use over the counter Emetrol over the counter for nausea.     Begin EmergenC Immune plus vitamin pack over the counter which contains extra vitamin C, vitamin D, and zinc.  Begin aspirin 81 mg daily as a means of thinning of blood and clot prevention given the potential risk of clots with COVID.  Continue the aspirin for about a month   If you are having trouble breathing, if you are very weak, have high fever 103 or higher  consistently despite Tylenol, or uncontrollable nausea and vomiting, then call or go to the emergency department.    Covid symptoms such as fatigue and cough can linger over 2 weeks, even after the initial fever, aches, chills, and other initial symptoms.   Self Quarantine and Isolation: Log onto [employer] for up to date quarantine recommendations.   [email or web address]   If you test Covid +, regardless of vaccination status:  Stay home for 5 days. If you have no symptoms or your symptoms are resolving after 5 days, then you can leave your house. Continue to wear a mask around others for 5 additional days. If you have a fever, continue to stay home until your fever resolves.  This could take 7-10 days from onset of symptoms or longer in some cases. If you have lots of coughing, sneezing, and significant runny nose and congestion, continue to stay at home until your symptoms are resolving   If you were exposed to someone with Covid: If you: Have been boosted OR Completed the primary series of Pfizer or Moderna vaccine within the last 6 months OR Completed the primary series of J&J vaccine within the last 2 months  Wear a mask around others for 10 days.  Test on day 5, if possible.   If you test positive on day 5 or more after exposure, and no symptoms, then wear a mask around others for 10 days  If you test positive and have symptoms, then follow the positive covid result isolation recommendations above If you test negative and have no symptoms on day 5 after exposure, then you may end isolation and be around others    If you were exposed to someone with Covid: If you: Completed the primary series of Pfizer or Moderna vaccine over 6 months ago and are not boosted OR Completed the primary series of J&J over 2 months ago and are not boosted OR Are unvaccinated  Stay home for 5 days. After that continue to wear a  mask around others for 5 additional days. If you can't quarantine you must wear a mask for 10 days. Test on day 5 if possible. If you test positive on day 5 or more after exposure, and no symptoms, then wear a mask around others for 10 days  If you test positive and have symptoms, then follow the positive covid result isolation recommendations above If you test negative and have no symptoms on day 5 after exposure, then you can end isolation but wear a mask around others for 5 more days   If you test Covid negative, but have respiratory symptoms:  Continue to wear a mask around others for 5 additional days. If you have a fever, continue to stay home  until your fever resolves.   If you have lots of coughing, sneezing, and significant runny nose and congestion, continue to stay at home until your symptoms are resolving   Isolation means avoiding contact with people as much as possible.   Particularly in your house, isolate your self from others in a separate room, wear a mask when possible in the room, particularly if coughing a lot.   Have others bring food, water, medications, etc., to your door, but avoid direct contact with your household contacts during this time to avoid spreading the infection to them.   If you have a separate bathroom and living quarters during the next 2 weeks away from others, that would be preferable.    If you can't completely isolate, then wear a mask, wash hands frequently with soap and water for at least 15 seconds, minimize close contact with others, and have a friend or family member check regularly from a distance to make sure you are not getting seriously worse.     You should not be going out in public, should not be going to stores, to work or other public places until all your symptoms have resolved.  One of the goals is to limit spread to high risk people; people that are older and elderly, people with multiple health issues like diabetes, heart disease, lung  disease, and anybody that has weakened immune systems such as people with cancer or on immunosuppressive therapy.    Tayo was seen today for covid positive.  Diagnoses and all orders for this visit:  COVID-19 virus infection  Cough  Chills  Other orders -     Multiple Vitamins-Minerals (EMERGEN-C IMMUNE PLUS) PACK; Take 1 tablet by mouth 2 (two) times daily. -     promethazine-dextromethorphan (PROMETHAZINE-DM) 6.25-15 MG/5ML syrup; Take 5 mLs by mouth 4 (four) times daily as needed for cough.  f/u prn

## 2020-04-19 NOTE — Patient Instructions (Signed)
[facility] [phone]  General recommendations if you have respiratory symptoms:  We recommend you rest, hydrate well with water and clear fluids throughout the day such as water, soup broth, ice chips, or possibly pedialyte or G2 no sugar gatorade.    You can use Tylenol over the counter for pain or fever every 4 - 6 hours  Begin the Promethazine DM cough syrup as sent to the pharmacy for cough and nausea   you can use over the counter Emetrol over the counter for nausea.     Begin EmergenC Immune plus vitamin pack over the counter which contains extra vitamin C, vitamin D, and zinc.  Begin aspirin 81 mg daily as a means of thinning of blood and clot prevention given the potential risk of clots with COVID.  Continue the aspirin for about a month   If you are having trouble breathing, if you are very weak, have high fever 103 or higher consistently despite Tylenol, or uncontrollable nausea and vomiting, then call or go to the emergency department.    Covid symptoms such as fatigue and cough can linger over 2 weeks, even after the initial fever, aches, chills, and other initial symptoms.   Self Quarantine and Isolation: Log onto [employer] for up to date quarantine recommendations.   [email or web address]   If you test Covid +, regardless of vaccination status:  Stay home for 5 days. If you have no symptoms or your symptoms are resolving after 5 days, then you can leave your house. Continue to wear a mask around others for 5 additional days. If you have a fever, continue to stay home until your fever resolves.  This could take 7-10 days from onset of symptoms or longer in some cases. If you have lots of coughing, sneezing, and significant runny nose and congestion, continue to stay at home until your symptoms are resolving   If you were exposed to someone with Covid: If you: Have been  boosted OR Completed the primary series of Pfizer or Moderna vaccine within the last 6 months OR Completed the primary series of J&J vaccine within the last 2 months  Wear a mask around others for 10 days.  Test on day 5, if possible.   If you test positive on day 5 or more after exposure, and no symptoms, then wear a mask around others for 10 days  If you test positive and have symptoms, then follow the positive covid result isolation recommendations above If you test negative and have no symptoms on day 5 after exposure, then you may end isolation and be around others    If you were exposed to someone with Covid: If you: Completed the primary series of Pfizer or Moderna vaccine over 6 months ago and are not boosted OR Completed the primary series of J&J over 2 months ago and are not boosted OR Are unvaccinated  Stay home for 5 days. After that continue to wear a mask around others for 5 additional days. If you can't quarantine you must wear a mask for 10 days. Test on day 5 if possible. If you test positive on day 5 or more after exposure, and no symptoms, then wear a mask around others for 10 days  If you test positive and have symptoms, then follow the positive covid result isolation recommendations above If you test negative and have no symptoms on day 5 after exposure, then you can end isolation but wear a mask around others for 5 more days  If you test Covid negative, but have respiratory symptoms:  Continue to wear a mask around others for 5 additional days. If you have a fever, continue to stay home until your fever resolves.   If you have lots of coughing, sneezing, and significant runny nose and congestion, continue to stay at home until your symptoms are resolving   Isolation means avoiding contact with people as much as possible.   Particularly in your house, isolate your self from others in a separate room, wear a mask when possible in the room, particularly if  coughing a lot.   Have others bring food, water, medications, etc., to your door, but avoid direct contact with your household contacts during this time to avoid spreading the infection to them.   If you have a separate bathroom and living quarters during the next 2 weeks away from others, that would be preferable.    If you can't completely isolate, then wear a mask, wash hands frequently with soap and water for at least 15 seconds, minimize close contact with others, and have a friend or family member check regularly from a distance to make sure you are not getting seriously worse.     You should not be going out in public, should not be going to stores, to work or other public places until all your symptoms have resolved.  One of the goals is to limit spread to high risk people; people that are older and elderly, people with multiple health issues like diabetes, heart disease, lung disease, and anybody that has weakened immune systems such as people with cancer or on immunosuppressive therapy.

## 2020-05-30 ENCOUNTER — Ambulatory Visit: Payer: Self-pay | Admitting: Endocrinology

## 2020-12-15 ENCOUNTER — Other Ambulatory Visit: Payer: Self-pay | Admitting: Endocrinology

## 2020-12-15 DIAGNOSIS — E890 Postprocedural hypothyroidism: Secondary | ICD-10-CM

## 2020-12-17 NOTE — Telephone Encounter (Signed)
Vm left for patient to callback to schedule appt before refill

## 2021-01-04 ENCOUNTER — Other Ambulatory Visit: Payer: Self-pay

## 2021-01-04 ENCOUNTER — Telehealth: Payer: Self-pay | Admitting: Endocrinology

## 2021-01-04 DIAGNOSIS — E890 Postprocedural hypothyroidism: Secondary | ICD-10-CM

## 2021-01-04 MED ORDER — LEVOTHYROXINE SODIUM 100 MCG PO TABS
100.0000 ug | ORAL_TABLET | Freq: Every day | ORAL | 3 refills | Status: DC
Start: 2021-01-04 — End: 2022-04-29

## 2021-01-04 NOTE — Telephone Encounter (Signed)
MEDICATION: levothyroxine (SYNTHROID) 100 MCG tablet  PHARMACY:    [id number] - [place], [place] - [address] Phone:  [phone]  Fax:  [phone]     HAS THE PATIENT CONTACTED THEIR PHARMACY?  Yes-Patient states request was denied  IS THIS A 90 DAY SUPPLY : Yes  IS PATIENT OUT OF MEDICATION: No  IF NOT; HOW MUCH IS LEFT:  Appox. 6 days  LAST APPOINTMENT DATE: @8 /30/2021  NEXT APPOINTMENT DATE:@10 /21/2022  DO WE HAVE YOUR PERMISSION TO LEAVE A DETAILED MESSAGE?: Yes  OTHER COMMENTS:    **Let patient know to contact pharmacy at the end of the day to make sure medication is ready. **  ** Please notify patient to allow 48-72 hours to process**  **Encourage patient to contact the pharmacy for refills or they can request refills through [facility]**

## 2021-01-04 NOTE — Telephone Encounter (Signed)
Rx sent 

## 2021-01-18 ENCOUNTER — Ambulatory Visit: Payer: Self-pay | Admitting: Endocrinology

## 2021-09-30 ENCOUNTER — Telehealth: Payer: Self-pay

## 2021-09-30 NOTE — Telephone Encounter (Signed)
Pt. Called stating that he was applying for life insurance and were reviewing his medical history and medications. They noticed that you had put him on Aspirin back in 2016-2017 and they wanted to know why that was recommended for the pt. I did let him know that you were out of town until next Monday and could review it then.

## 2021-10-24 ENCOUNTER — Encounter: Payer: Self-pay | Admitting: Medical

## 2021-10-30 ENCOUNTER — Telehealth: Payer: Self-pay | Admitting: Medical

## 2021-10-30 NOTE — Telephone Encounter (Signed)
Received medical records request from [employer]. Forwarded to [employer].

## 2021-11-28 ENCOUNTER — Encounter: Payer: Self-pay | Admitting: Medical

## 2021-11-28 ENCOUNTER — Telehealth: Payer: Self-pay | Admitting: Medical

## 2021-11-28 NOTE — Telephone Encounter (Signed)

## 2021-12-03 ENCOUNTER — Encounter: Payer: Self-pay | Admitting: Family Medicine

## 2022-02-10 ENCOUNTER — Other Ambulatory Visit: Payer: Self-pay

## 2022-02-10 DIAGNOSIS — E890 Postprocedural hypothyroidism: Secondary | ICD-10-CM

## 2022-02-10 NOTE — Telephone Encounter (Signed)
Refilled denied patient last seen in 2021.

## 2022-04-29 ENCOUNTER — Encounter: Payer: Self-pay | Admitting: Internal Medicine

## 2022-04-29 ENCOUNTER — Ambulatory Visit (INDEPENDENT_AMBULATORY_CARE_PROVIDER_SITE_OTHER): Payer: Self-pay | Admitting: Internal Medicine

## 2022-04-29 VITALS — BP 120/82 | HR 66 | Ht 64.0 in | Wt 201.0 lb

## 2022-04-29 DIAGNOSIS — E890 Postprocedural hypothyroidism: Secondary | ICD-10-CM

## 2022-04-29 DIAGNOSIS — Z713 Dietary counseling and surveillance: Secondary | ICD-10-CM

## 2022-04-29 MED ORDER — LEVOTHYROXINE SODIUM 100 MCG PO TABS: 100.0000 ug | ORAL_TABLET | Freq: Every day | ORAL | 3 refills | Status: DC

## 2022-04-29 NOTE — Progress Notes (Signed)
Patient ID: Jonathan Mcmahon, male   DOB: 1970-10-12, 52 y.o.   MRN: [id number]  HPI  Jonathan Mcmahon is a 52 y.o.-year-old male, returning for follow-up for postablative hypothyroidism.  He previously saw Dr. Loanne Drilling, last visit 2 years and 6 months ago.  Pt. Has a history of Graves' disease (muscle weakness, tremors, palpitations, weight loss), initially treated with methimazole, but he ended up having radioactive iodine treatment in 2017.  He developed post ablative hypothyroidism afterwards and was started on levothyroxine.  He mentions that he is now 5 days off levothyroxine after he ran out.  He is now doing intermittent fasting, started last year.  He eats a lot of veggies and eliminated concentrated sweets.  He also started walking for exercise.  He lost weight (19 lbs in last 2 years).  Pt. is on levothyroxine 100 mcg daily, taken: - keeps the tabs in his truck - misses 3x a week - takes 2 tabs the next day - in am - fasting - at least 30 min from b'fast - now skips b'fast - no calcium - no iron - no multivitamins - no PPIs - not on Biotin  I reviewed pt's thyroid tests: Lab Results  Component Value Date   TSH 0.87 11/28/2019   TSH 3.07 03/07/2019   TSH 25.320 (H) 05/18/2018   TSH 3.99 10/22/2015   TSH 0.07 (L) 06/18/2015   TSH 0.12 (L) 05/23/2015   TSH 0.16 (L) 05/07/2015   TSH 0.15 (L) 04/20/2015   TSH <0.008 (L) 04/03/2015   TSH 0.829 08/21/2014   FREET4 1.05 11/28/2019   FREET4 0.90 03/07/2019   FREET4 0.60 (L) 05/18/2018   FREET4 0.48 (L) 10/22/2015   FREET4 0.51 (L) 06/18/2015   FREET4 2.37 (H) 05/23/2015   FREET4 0.93 05/07/2015   FREET4 2.08 (H) 04/20/2015   FREET4 4.66 (H) 04/03/2015   Antithyroid antibodies: Component     Latest Ref Rng 04/06/2015  Thyroperoxidase Ab SerPl-aCnc     <9 IU/mL >900 (H)   Thyroglobulin Ab     <2 IU/mL 1     Thyroid U/S (04/06/2015) showed no nodules: Right thyroid lobe: 6.8 x 2.2 x 2.8 cm. Heterogeneous without focal  nodule. Left thyroid lobe: 6.4 x 2.1 x 2.4 cm. Heterogeneous without focal nodule. Isthmus Thickness: 4 mm.  No nodules visualized. Lymphadenopathy: None visualized.   IMPRESSION: Heterogeneous and enlarged gland without focal nodule.  Pt denies - fatigue - weight gain - cold intolerance - constipation - dry skin - hair loss  He did notice some weakness lately.  Pt denies: - feeling nodules in neck - hoarseness - dysphagia - choking - SOB with lying down  She has no FH of thyroid disorders. No FH of thyroid cancer. No h/o radiation tx to head or neck. No recent use of iodine supplements.  No herbal supplements.  No recent steroid use.  Pt. also has a history of kidney stones, fatty liver, HTN, thoracic spinal stenosis, s/p spinal fusion.  ROS: + see HPI  Past Medical History:  Diagnosis Date   Cyst of left kidney    Disc disorder    "MRI bulging disc L4 and L5"   Fatty liver    Kidney stone    Past Surgical History:  Procedure Laterality Date   APPENDECTOMY     Social History   Socioeconomic History   Marital status: Married    Spouse name: Not on file   Number of children: 3   Years of education: 68  Highest education level: Not on file  Occupational History   Occupation: Administrator   Tobacco Use   Smoking status: Never   Smokeless tobacco: Never  Substance and Sexual Activity   Alcohol use: No    Alcohol/week: 0.0 standard drinks of alcohol   Drug use: No   Sexual activity: Not on file  Other Topics Concern   Not on file  Social History Narrative   Walks for exercise some.  Recent changed eating habits to more healthy.  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     Drinks about a couple cups of tea a week    Social Determinants of Radio broadcast assistant Strain: Not on file  Food Insecurity: Not on file  Transportation Needs: Not on file  Physical Activity: Not on file  Stress: Not on file  Social  Connections: Not on file  Intimate Partner Violence: Not on file   Current Outpatient Medications on File Prior to Visit  Medication Sig Dispense Refill   hydrochlorothiazide (HYDRODIURIL) 25 MG tablet TAKE 1 TABLET BY MOUTH EVERY DAY (Patient not taking: Reported on 04/19/2020) 90 tablet 0   levothyroxine (SYNTHROID) 100 MCG tablet Take 1 tablet (100 mcg total) by mouth daily. 90 tablet 3   Multiple Vitamins-Minerals (EMERGEN-C IMMUNE PLUS) PACK Take 1 tablet by mouth 2 (two) times daily. 10 each 0   potassium chloride (K-DUR) 10 MEQ tablet TAKE 1 TABLET BY MOUTH EVERY DAY (Patient not taking: Reported on 04/19/2020) 30 tablet 0   promethazine-dextromethorphan (PROMETHAZINE-DM) 6.25-15 MG/5ML syrup Take 5 mLs by mouth 4 (four) times daily as needed for cough. 120 mL 0   No current facility-administered medications on file prior to visit.   No Known Allergies Family History  Problem Relation Age of Onset   Renal cancer Mother    CAD Father    Heart disease Father 70       died of MI   Diabetes Father    Gallbladder disease Neg Hx    Thyroid disease Neg Hx    PE: BP 120/82 (BP Location: Right Arm, Patient Position: Sitting, Cuff Size: Normal)   Pulse 66   Ht 5\' 4"  (1.626 m)   Wt 201 lb (91.2 kg)   SpO2 98%   BMI 34.50 kg/m  Wt Readings from Last 3 Encounters:  04/29/22 201 lb (91.2 kg)  04/19/20 197 lb (89.4 kg)  11/28/19 197 lb 3.2 oz (89.4 kg)   Constitutional: overweight, in NAD Eyes:  EOMI, no exophthalmos ENT: no neck masses, no cervical lymphadenopathy Cardiovascular: RRR, No MRG Respiratory: CTA B Musculoskeletal: no deformities Skin:no rashes Neurological: no tremor with outstretched hands  ASSESSMENT: 1. Hypothyroidism  2.  Nutritional counseling  PLAN:  1. Patient with long-standing hypothyroidism, on levothyroxine therapy. - he appears euthyroid.  - he does not appear to have a goiter, thyroid nodules, or neck compression symptoms - latest thyroid labs  reviewed with pt. >> normal: Lab Results  Component Value Date   TSH 0.87 11/28/2019  - he continues on LT4 100 mcg daily - pt feels good on this dose.  He does have some weakness recently. - we discussed about taking the thyroid hormone every day, with water, >30 minutes before breakfast, separated by >4 hours from acid reflux medications, calcium, iron, multivitamins. Pt. is missing doses and even though he takes 2 tablets the next day, this happens frequently enough to worry about not getting enough levothyroxine.  Moreover, he was off the medication for the last 5 days after he ran out.  We discussed about the absolute need to take this medication every day and not miss doses.  Discussed about symptoms and side effects of uncontrolled hypothyroidism to include increased fatigue, weight gain, constipation, cold intolerance, hair loss, depression and long-term congestive heart failure and dementia.  We also discussed that the significant fatigue can put him at risk for MVAs.  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 - will check thyroid tests in 5 to 6 weeks, after he starts taking the levothyroxine daily and correctly: TSH and fT4 - Otherwise, I will see him back in 6 months  2.  Nutritional counseling -Patient describes that he started intermittent fasting last year.  He asks my advised whether this is a good idea and if it interacts with hypothyroidism.  -He was able to lose approximately 19 pounds since he started. -We discussed that this is a very good idea not only for his weight, but also to help with his fatty liver -However, he is currently not eating breakfast and his first meal of the day is at 3 PM.  He has a second meal around 7 to 8 PM.  We discussed that the ideal fasting window is actually in the afternoon so I advised him to try to have breakfast sometimes between 9-10 (he says that he cannot eat when he wakes up, which is around 5 AM) and the second  meal of the day around 3 PM.  He will try this. -I also recommended a reference book: The obesity code by Dr. Sharman Cheek.  Orders Placed This Encounter  Procedures   TSH   T4, free   Philemon Kingdom, MD PhD [facility] Endocrinology

## 2022-04-29 NOTE — Patient Instructions (Addendum)
Please come back for labs in 5-6 weeks.  Please continue Levothyroxine 100 mcg daily.  Take the thyroid hormone every day, with water, at least 30 minutes before breakfast, separated by at least 4 hours from: - acid reflux medications - calcium - iron - multivitamins  You should have an endocrinology follow-up appointment in 6 months.

## 2022-09-11 ENCOUNTER — Other Ambulatory Visit: Payer: Self-pay | Admitting: Internal Medicine

## 2022-09-11 DIAGNOSIS — E890 Postprocedural hypothyroidism: Secondary | ICD-10-CM

## 2022-11-10 ENCOUNTER — Ambulatory Visit: Payer: Self-pay | Admitting: Internal Medicine

## 2022-11-10 NOTE — Progress Notes (Deleted)
Patient ID: Jonathan Mcmahon, male   DOB: 01-19-1971, 52 y.o.   MRN: [id number]  HPI  Jonathan Mcmahon is a 52 y.o.-year-old male, returning for follow-up for postablative hypothyroidism.  He previously saw Dr. Everardo Mcmahon, but last visit with me was 7 months ago.  Interim history:  Reviewed history: Pt. Has a history of Graves' disease (muscle weakness, tremors, palpitations, weight loss), initially treated with methimazole, but he ended up having radioactive iodine treatment in 2017.  He developed post ablative hypothyroidism afterwards and was started on levothyroxine.  At our visit from 03/2022, he was 5 days off levothyroxine after he ran out.  At that time, he was doing intermittent fasting, started last year - eating a lot of veggies and eliminated concentrated sweets.  He also started walking for exercise.  He lost weight (19 lbs in the previous 2 years).  Pt. is on levothyroxine 100 mcg daily, taken: - keeps the tabs in his truck -Previously missing this 3x a week- taking 2 tabs the next day - in am - fasting - at least 30 min from b'fast  - no calcium - no iron - no multivitamins - no PPIs - not on Biotin  I reviewed pt's thyroid tests: Lab Results  Component Value Date   TSH 0.87 11/28/2019   TSH 3.07 03/07/2019   TSH 25.320 (H) 05/18/2018   TSH 3.99 10/22/2015   TSH 0.07 (L) 06/18/2015   TSH 0.12 (L) 05/23/2015   TSH 0.16 (L) 05/07/2015   TSH 0.15 (L) 04/20/2015   TSH <0.008 (L) 04/03/2015   TSH 0.829 08/21/2014   FREET4 1.05 11/28/2019   FREET4 0.90 03/07/2019   FREET4 0.60 (L) 05/18/2018   FREET4 0.48 (L) 10/22/2015   FREET4 0.51 (L) 06/18/2015   FREET4 2.37 (H) 05/23/2015   FREET4 0.93 05/07/2015   FREET4 2.08 (H) 04/20/2015   FREET4 4.66 (H) 04/03/2015   Antithyroid antibodies: Component     Latest Ref Rng 04/06/2015  Thyroperoxidase Ab SerPl-aCnc     <9 IU/mL >900 (H)   Thyroglobulin Ab     <2 IU/mL 1     Thyroid U/S (04/06/2015) showed no nodules: Right  thyroid lobe: 6.8 x 2.2 x 2.8 cm. Heterogeneous without focal nodule. Left thyroid lobe: 6.4 x 2.1 x 2.4 cm. Heterogeneous without focal nodule. Isthmus Thickness: 4 mm.  No nodules visualized. Lymphadenopathy: None visualized.   IMPRESSION: Heterogeneous and enlarged gland without focal nodule.  Pt denies: - fatigue - weight gain - cold intolerance - constipation - dry skin - hair loss  Pt denies: - feeling nodules in neck - hoarseness - dysphagia - choking  She has no FH of thyroid disorders. No FH of thyroid cancer. No h/o radiation tx to head or neck. No recent use of iodine supplements.  No herbal supplements.  No recent steroid use.  Pt. also has a history of kidney stones, fatty liver, HTN, thoracic spinal stenosis, s/p spinal fusion.  ROS: + see HPI  Past Medical History:  Diagnosis Date   Cyst of left kidney    Disc disorder    "MRI bulging disc L4 and L5"   Fatty liver    Kidney stone    Past Surgical History:  Procedure Laterality Date   APPENDECTOMY     Social History   Socioeconomic History   Marital status: Married    Spouse name: Not on file   Number of children: 3   Years of education: 12   Highest education level: Not on  file  Occupational History   Occupation: Naval architect   Tobacco Use   Smoking status: Never   Smokeless tobacco: Never  Substance and Sexual Activity   Alcohol use: No    Alcohol/week: 0.0 standard drinks of alcohol   Drug use: No   Sexual activity: Not on file  Other Topics Concern   Not on file  Social History Narrative   Walks for exercise some.  Recent changed eating habits to more healthy.  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     Drinks about a couple cups of tea a week    Social Determinants of Corporate investment banker Strain: Not on file  Food Insecurity: Not on file  Transportation Needs: Not on file  Physical Activity: Not on file  Stress: Not on file  Social  Connections: Not on file  Intimate Partner Violence: Not on file   Current Outpatient Medications on File Prior to Visit  Medication Sig Dispense Refill   hydrochlorothiazide (HYDRODIURIL) 25 MG tablet TAKE 1 TABLET BY MOUTH EVERY DAY (Patient not taking: Reported on 04/19/2020) 90 tablet 0   levothyroxine (SYNTHROID) 100 MCG tablet TAKE 1 TABLET BY MOUTH EVERY DAY 90 tablet 0   Multiple Vitamins-Minerals (EMERGEN-C IMMUNE PLUS) PACK Take 1 tablet by mouth 2 (two) times daily. 10 each 0   potassium chloride (K-DUR) 10 MEQ tablet TAKE 1 TABLET BY MOUTH EVERY DAY (Patient not taking: Reported on 04/19/2020) 30 tablet 0   promethazine-dextromethorphan (PROMETHAZINE-DM) 6.25-15 MG/5ML syrup Take 5 mLs by mouth 4 (four) times daily as needed for cough. 120 mL 0   No current facility-administered medications on file prior to visit.   No Known Allergies Family History  Problem Relation Age of Onset   Renal cancer Mother    CAD Father    Heart disease Father 41       died of MI   Diabetes Father    Gallbladder disease Neg Hx    Thyroid disease Neg Hx    PE: There were no vitals taken for this visit. Wt Readings from Last 3 Encounters:  04/29/22 201 lb (91.2 kg)  04/19/20 197 lb (89.4 kg)  11/28/19 197 lb 3.2 oz (89.4 kg)   Constitutional: overweight, in NAD Eyes:  EOMI, no exophthalmos ENT: no neck masses, no cervical lymphadenopathy Cardiovascular: RRR, No MRG Respiratory: CTA B Musculoskeletal: no deformities Skin:no rashes Neurological: no tremor with outstretched hands  ASSESSMENT: 1. Hypothyroidism  2.  Nutritional counseling  PLAN:  1. Patient with longstanding hypothyroidism, on levothyroxine therapy - He does not appear to have a goiter, thyroid nodules, or neck compression symptoms - latest thyroid labs reviewed with pt. >> normal: Lab Results  Component Value Date   TSH 0.87 11/28/2019  - he continues on LT4 100 mcg daily - pt feels good on this dose. - we  discussed about taking the thyroid hormone every day, with water, >30 minutes before breakfast, separated by >4 hours from acid reflux medications, calcium, iron, multivitamins.  At last visit, patient was off the medication for the previous 5 days after he ran out.  We discussed about the absolute need to take this medication every day and not miss doses.  Discussed about symptoms and side effects of uncontrolled hypothyroidism to include increased fatigue, weight gain, constipation, cold intolerance, hair loss, depression, and long-term: Congestive heart failure and dementia.  We also discussed that the significant fatigue can put him at risk  for MVAs.  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 - At last visit I advised him to start taking the LT4 consistently and come back for labs in 1.5 months, but he did not return - will check thyroid tests today: TSH and fT4 - If labs are abnormal, he will need to return for repeat TFTs in 1.5 months - OTW, I will see him back in 6 months  2.  Nutritional counseling -At last visit, he described starting intermittent fasting in the previous year.  He was able to lose approximately 19 pounds.  We discussed that significant weight loss can influence his LT4 requirement. -Also, upon his questioning, we discussed about how to optimize intermittent fasting, and recommended a reference book to help with this -Since last visit, **  No orders of the defined types were placed in this encounter.  Carlus Pavlov, MD PhD [facility] Endocrinology

## 2022-11-28 ENCOUNTER — Other Ambulatory Visit: Payer: Self-pay | Admitting: Internal Medicine

## 2022-11-28 DIAGNOSIS — E890 Postprocedural hypothyroidism: Secondary | ICD-10-CM

## 2022-12-11 ENCOUNTER — Ambulatory Visit: Payer: Self-pay | Admitting: Internal Medicine

## 2022-12-26 ENCOUNTER — Ambulatory Visit: Payer: Self-pay | Admitting: Internal Medicine

## 2022-12-26 NOTE — Progress Notes (Deleted)
Patient ID: Jonathan Mcmahon, male   DOB: 08-Dec-1970, 52 y.o.   MRN: [id number]  HPI  Jonathan Mcmahon is a 52 y.o.-year-old male, returning for follow-up for postablative hypothyroidism.  He previously saw Dr. Everardo All, but last visit with me 7 months ago.    Interim history:  Reviewed history: Pt. Has a history of Graves' disease (muscle weakness, tremors, palpitations, weight loss), initially treated with methimazole, but he ended up having radioactive iodine treatment in 2017.  He developed post ablative hypothyroidism afterwards and was started on levothyroxine.  He mentions that he is now 5 days off levothyroxine after he ran out.  He is now doing intermittent fasting, started last year.  He eats a lot of veggies and eliminated concentrated sweets.  He also started walking for exercise.  He lost weight (19 lbs in 2 years before our visit in 03/2022).  Pt. is on levothyroxine 100 mcg daily, taken: - keeps the tabs in his truck - misses 3x a week - takes 2 tabs the next day - in am - fasting - at least 30 min from b'fast - now skips b'fast - no calcium - no iron - no multivitamins - no PPIs - not on Biotin  I reviewed pt's thyroid tests: Lab Results  Component Value Date   TSH 0.87 11/28/2019   TSH 3.07 03/07/2019   TSH 25.320 (H) 05/18/2018   TSH 3.99 10/22/2015   TSH 0.07 (L) 06/18/2015   TSH 0.12 (L) 05/23/2015   TSH 0.16 (L) 05/07/2015   TSH 0.15 (L) 04/20/2015   TSH <0.008 (L) 04/03/2015   TSH 0.829 08/21/2014   FREET4 1.05 11/28/2019   FREET4 0.90 03/07/2019   FREET4 0.60 (L) 05/18/2018   FREET4 0.48 (L) 10/22/2015   FREET4 0.51 (L) 06/18/2015   FREET4 2.37 (H) 05/23/2015   FREET4 0.93 05/07/2015   FREET4 2.08 (H) 04/20/2015   FREET4 4.66 (H) 04/03/2015   Antithyroid antibodies: Component     Latest Ref Rng 04/06/2015  Thyroperoxidase Ab SerPl-aCnc     <9 IU/mL >900 (H)   Thyroglobulin Ab     <2 IU/mL 1     Thyroid U/S (04/06/2015) showed no nodules: Right thyroid  lobe: 6.8 x 2.2 x 2.8 cm. Heterogeneous without focal nodule. Left thyroid lobe: 6.4 x 2.1 x 2.4 cm. Heterogeneous without focal nodule. Isthmus Thickness: 4 mm.  No nodules visualized. Lymphadenopathy: None visualized.   IMPRESSION: Heterogeneous and enlarged gland without focal nodule.  Pt denies - fatigue - weight gain - cold intolerance - constipation - dry skin - hair loss  He did notice some weakness lately.  Pt denies: - feeling nodules in neck - hoarseness - dysphagia - choking  She has no FH of thyroid disorders. No FH of thyroid cancer. No h/o radiation tx to head or neck. No recent use of iodine supplements.  No herbal supplements.  No recent steroid use.  Pt. also has a history of kidney stones, fatty liver, HTN, thoracic spinal stenosis, s/p spinal fusion.  ROS: + see HPI  Past Medical History:  Diagnosis Date   Cyst of left kidney    Disc disorder    "MRI bulging disc L4 and L5"   Fatty liver    Kidney stone    Past Surgical History:  Procedure Laterality Date   APPENDECTOMY     Social History   Socioeconomic History   Marital status: Married    Spouse name: Not on file   Number of children: 3  Years of education: 24   Highest education level: Not on file  Occupational History   Occupation: Truck Hospital doctor   Tobacco Use   Smoking status: Never   Smokeless tobacco: Never  Substance and Sexual Activity   Alcohol use: No    Alcohol/week: 0.0 standard drinks of alcohol   Drug use: No   Sexual activity: Not on file  Other Topics Concern   Not on file  Social History Narrative   Walks for exercise some.  Recent changed eating habits to more healthy.  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     Drinks about a couple cups of tea a week    Social Determinants of Corporate investment banker Strain: Not on file  Food Insecurity: Not on file  Transportation Needs: Not on file  Physical Activity: Not on file   Stress: Not on file  Social Connections: Not on file  Intimate Partner Violence: Not on file   Current Outpatient Medications on File Prior to Visit  Medication Sig Dispense Refill   hydrochlorothiazide (HYDRODIURIL) 25 MG tablet TAKE 1 TABLET BY MOUTH EVERY DAY (Patient not taking: Reported on 04/19/2020) 90 tablet 0   levothyroxine (SYNTHROID) 100 MCG tablet TAKE 1 TABLET BY MOUTH EVERY DAY 90 tablet 0   Multiple Vitamins-Minerals (EMERGEN-C IMMUNE PLUS) PACK Take 1 tablet by mouth 2 (two) times daily. 10 each 0   potassium chloride (K-DUR) 10 MEQ tablet TAKE 1 TABLET BY MOUTH EVERY DAY (Patient not taking: Reported on 04/19/2020) 30 tablet 0   promethazine-dextromethorphan (PROMETHAZINE-DM) 6.25-15 MG/5ML syrup Take 5 mLs by mouth 4 (four) times daily as needed for cough. 120 mL 0   No current facility-administered medications on file prior to visit.   No Known Allergies Family History  Problem Relation Age of Onset   Renal cancer Mother    CAD Father    Heart disease Father 6       died of MI   Diabetes Father    Gallbladder disease Neg Hx    Thyroid disease Neg Hx    PE: There were no vitals taken for this visit. Wt Readings from Last 3 Encounters:  04/29/22 201 lb (91.2 kg)  04/19/20 197 lb (89.4 kg)  11/28/19 197 lb 3.2 oz (89.4 kg)   Constitutional: overweight, in NAD Eyes:  EOMI, no exophthalmos ENT: no neck masses, no cervical lymphadenopathy Cardiovascular: RRR, No MRG Respiratory: CTA B Musculoskeletal: no deformities Skin:no rashes Neurological: no tremor with outstretched hands  ASSESSMENT: 1. Hypothyroidism  2.  Nutritional counseling  PLAN:  1. Patient with longstanding hypothyroidism, on levothyroxine therapy - latest thyroid labs reviewed with pt. >> normal, but he did not return for labs after our last visit, as advised: Lab Results  Component Value Date   TSH 0.87 11/28/2019  - he continues on LT4 100 mcg daily - pt feels good on this dose. -  we discussed about taking the thyroid hormone every day, with water, >30 minutes before breakfast, separated by >4 hours from acid reflux medications, calcium, iron, multivitamins. Pt. is taking it correctly.  He was missing several tablets a week at last visit and even though he was taking 2 tablets the next day, I strongly advised him to try to take it every day.  At last visit he was off the medication for 5 days after he ran out.  I did advise him about the possible side effects of uncontrolled hypothyroidism to  include fatigue, weight gain, constipation, cold intolerance, depression, and long-term congestive heart failure and dementia.  He drives a truck and uncontrolled hypothyroidism can predispose him to significant fatigue and delayed reflexes.  As of now, he is not missing doses. - will check thyroid tests today: TSH and fT4 - If labs are abnormal, he will need to return for repeat TFTs in 1.5 months -I will see him back in 3 to 4 months  He needs refills.  Carlus Pavlov, MD PhD [facility] Endocrinology

## 2023-02-28 ENCOUNTER — Other Ambulatory Visit: Payer: Self-pay | Admitting: Internal Medicine

## 2023-02-28 DIAGNOSIS — E890 Postprocedural hypothyroidism: Secondary | ICD-10-CM

## 2023-06-02 ENCOUNTER — Other Ambulatory Visit: Payer: Self-pay | Admitting: Internal Medicine

## 2023-06-02 DIAGNOSIS — E890 Postprocedural hypothyroidism: Secondary | ICD-10-CM

## 2023-09-02 ENCOUNTER — Other Ambulatory Visit: Payer: Self-pay | Admitting: Internal Medicine

## 2023-09-02 ENCOUNTER — Telehealth: Payer: Self-pay

## 2023-09-02 DIAGNOSIS — E890 Postprocedural hypothyroidism: Secondary | ICD-10-CM

## 2023-09-02 NOTE — Telephone Encounter (Signed)
 Pharmacy requested a refill on his Levothyroxine . This patient has not been seen since 03/2022. Per last ov note he was supposed to: Return in about 6 months (around 10/28/2022). Please come back for labs in 5-6 weeks.      Please reach out to the patient and get him scheduled.

## 2023-09-07 MED ORDER — LEVOTHYROXINE SODIUM 100 MCG PO TABS: 100.0000 ug | ORAL_TABLET | Freq: Every day | ORAL | 0 refills | Status: DC

## 2023-09-07 NOTE — Telephone Encounter (Signed)
 I have sent another 30 days to last until his appt.

## 2023-10-04 ENCOUNTER — Other Ambulatory Visit: Payer: Self-pay | Admitting: Internal Medicine

## 2023-10-04 DIAGNOSIS — E890 Postprocedural hypothyroidism: Secondary | ICD-10-CM

## 2023-10-05 ENCOUNTER — Encounter: Payer: Self-pay | Admitting: Internal Medicine

## 2023-10-05 ENCOUNTER — Ambulatory Visit (INDEPENDENT_AMBULATORY_CARE_PROVIDER_SITE_OTHER): Payer: Self-pay | Admitting: Internal Medicine

## 2023-10-05 VITALS — BP 122/82 | HR 68 | Resp 20 | Ht 64.0 in | Wt 217.4 lb

## 2023-10-05 DIAGNOSIS — E890 Postprocedural hypothyroidism: Secondary | ICD-10-CM

## 2023-10-05 LAB — T4, FREE: Free T4: 1.3 ng/dL (ref 0.8–1.8)

## 2023-10-05 LAB — TSH: TSH: 3.9 m[IU]/L (ref 0.40–4.50)

## 2023-10-05 NOTE — Patient Instructions (Addendum)
 Please stop at the lab.  Please continue Levothyroxine  100 mcg daily.  Take the thyroid  hormone every day, with water, at least 30 minutes before breakfast, separated by at least 4 hours from: - acid reflux medications - calcium - iron - multivitamins  You should have an endocrinology follow-up appointment in 1 year.

## 2023-10-05 NOTE — Telephone Encounter (Signed)
 Refill request complete

## 2023-10-05 NOTE — Progress Notes (Signed)
 Patient ID: Jonathan Mcmahon, male   DOB: 03/15/1971, 53 y.o.   MRN: [id number]  HPI  Jonathan Mcmahon is a 53 y.o.-year-old male, returning for follow-up for postablative hypothyroidism.  He previously saw Dr. Kassie, last visit with me 1.5 years ago.  Previous visit with Dr. Kassie: 2 years and 6 months prior.  Interim history: He has fatigue and also weight gain of 16 lbs. OTW has no complaint today.  Reviewed and addended history: Pt. Has a history of Graves' disease (muscle weakness, tremors, palpitations, weight loss), initially treated with methimazole , but he ended up having radioactive iodine treatment in 2017.  He developed post ablative hypothyroidism afterwards and was started on levothyroxine .  At last visit, he was doing intermittent fasting, started last year.  He eats a lot of veggies and eliminated concentrated sweets.  He also started walking for exercise.  He lost weight (19 lbs in last 2 years).  Pt. is on levothyroxine  100 mcg daily, taken: - in am - fasting - at least 30 min from b'fast or skips b'fast - no calcium - no iron - no multivitamins - no PPIs - not on Biotin  I reviewed pt's thyroid  tests: Lab Results  Component Value Date   TSH 0.87 11/28/2019   TSH 3.07 03/07/2019   TSH 25.320 (H) 05/18/2018   TSH 3.99 10/22/2015   TSH 0.07 (L) 06/18/2015   TSH 0.12 (L) 05/23/2015   TSH 0.16 (L) 05/07/2015   TSH 0.15 (L) 04/20/2015   TSH <0.008 (L) 04/03/2015   TSH 0.829 08/21/2014   FREET4 1.05 11/28/2019   FREET4 0.90 03/07/2019   FREET4 0.60 (L) 05/18/2018   FREET4 0.48 (L) 10/22/2015   FREET4 0.51 (L) 06/18/2015   FREET4 2.37 (H) 05/23/2015   FREET4 0.93 05/07/2015   FREET4 2.08 (H) 04/20/2015   FREET4 4.66 (H) 04/03/2015   Antithyroid antibodies: Component     Latest Ref Rng 04/06/2015  Thyroperoxidase Ab SerPl-aCnc     <9 IU/mL >900 (H)   Thyroglobulin Ab     <2 IU/mL 1     Thyroid  U/S (04/06/2015) showed no nodules: Right thyroid  lobe: 6.8 x 2.2 x  2.8 cm. Heterogeneous without focal nodule. Left thyroid  lobe: 6.4 x 2.1 x 2.4 cm. Heterogeneous without focal nodule. Isthmus Thickness: 4 mm.  No nodules visualized. Lymphadenopathy: None visualized.   IMPRESSION: Heterogeneous and enlarged gland without focal nodule.  Pt denies - cold intolerance - constipation  He has weight gain and fatigue.   Pt denies: - feeling nodules in neck - hoarseness - dysphagia - choking  She has no FH of thyroid  disorders. No FH of thyroid  cancer. No h/o radiation tx to head or neck. No herbal supplements.  No recent steroid use.  Pt. also has a history of kidney stones, fatty liver, HTN, thoracic spinal stenosis, s/p spinal fusion.  ROS: + see HPI  Past Medical History:  Diagnosis Date   Cyst of left kidney    Disc disorder    MRI bulging disc L4 and L5   Fatty liver    Kidney stone    Past Surgical History:  Procedure Laterality Date   APPENDECTOMY     Social History   Socioeconomic History   Marital status: Married    Spouse name: Not on file   Number of children: 3   Years of education: 12   Highest education level: Not on file  Occupational History   Occupation: [employer] doctor   Tobacco Use   Smoking  status: Never   Smokeless tobacco: Never  Substance and Sexual Activity   Alcohol use: No    Alcohol/week: 0.0 standard drinks of alcohol   Drug use: No   Sexual activity: Not on file  Other Topics Concern   Not on file  Social History Narrative   Walks for exercise some.  Recent changed eating habits to more healthy.  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     Drinks about a couple cups of tea a week    Social Drivers of Corporate investment banker Strain: Not on file  Food Insecurity: Not on file  Transportation Needs: Not on file  Physical Activity: Not on file  Stress: Not on file  Social Connections: Not on file  Intimate Partner Violence: Not on file   Current Outpatient  Medications on File Prior to Visit  Medication Sig Dispense Refill   hydrochlorothiazide  (HYDRODIURIL ) 25 MG tablet TAKE 1 TABLET BY MOUTH EVERY DAY (Patient not taking: Reported on 04/19/2020) 90 tablet 0   levothyroxine  (SYNTHROID ) 100 MCG tablet Take 1 tablet (100 mcg total) by mouth daily. 30 tablet 0   Multiple Vitamins-Minerals (EMERGEN-C IMMUNE PLUS) PACK Take 1 tablet by mouth 2 (two) times daily. 10 each 0   potassium chloride  (K-DUR) 10 MEQ tablet TAKE 1 TABLET BY MOUTH EVERY DAY (Patient not taking: Reported on 04/19/2020) 30 tablet 0   promethazine -dextromethorphan (PROMETHAZINE -DM) 6.25-15 MG/5ML syrup Take 5 mLs by mouth 4 (four) times daily as needed for cough. 120 mL 0   No current facility-administered medications on file prior to visit.   No Known Allergies Family History  Problem Relation Age of Onset   Renal cancer Mother    CAD Father    Heart disease Father 44       died of MI   Diabetes Father    Gallbladder disease Neg Hx    Thyroid  disease Neg Hx    PE: BP 122/82   Pulse 68   Resp 20   Ht 5' 4 (1.626 m)   Wt 217 lb 6.4 oz (98.6 kg)   SpO2 98%   BMI 37.32 kg/m  Wt Readings from Last 3 Encounters:  10/05/23 217 lb 6.4 oz (98.6 kg)  04/29/22 201 lb (91.2 kg)  04/19/20 197 lb (89.4 kg)   Constitutional: overweight, in NAD Eyes:  EOMI, no exophthalmos ENT: no neck masses, no cervical lymphadenopathy Cardiovascular: RRR, No MRG Respiratory: CTA B Musculoskeletal: no deformities Skin:no rashes Neurological: no tremor with outstretched hands  ASSESSMENT: 1. Hypothyroidism  PLAN:  1. Patient with longstanding hypothyroidism, on levothyroxine  therapy, returning after another long absence. - latest thyroid  labs reviewed with pt. >> normal: Lab Results  Component Value Date   TSH 0.87 11/28/2019  - he continues on LT4 100 mcg daily - at last visit, we could not check his TFTs as he was not taking levothyroxine  correctly.  He was skipping doses and  taking to the next day, however, before last visit he was out levothyroxine  for 5 days, after he ran out.  We did discuss at that time about side effects of uncontrolled hypothyroidism including fatigue, weight gain, constipation, cold intolerance, hair loss, depression, dementia, and congestive heart failure.  We also discussed about the fact that significant fatigue can put him at risk for MVAs.  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  I strongly advised him not  to run out of the medications again. - However, despite the above discussion, he now returns after 1.5 years due to the need for refills.  He did not return for labs in 5 to 6 weeks as advised at last visit... I again advised him to try to stay compliant with his followups. - pt feels good on this dose, but gained 16 lbs since last OV - we discussed about taking the thyroid  hormone every day, with water, >30 minutes before breakfast, separated by >4 hours from acid reflux medications, calcium, iron, multivitamins. Pt. is taking it correctly. - will check thyroid  tests today: TSH and fT4 - If labs are abnormal, he will need to return for repeat TFTs in 1.5 months - I will see him back in  1 year  Needs refills.  Orders Placed This Encounter  Procedures   TSH   T4, free   Lela Fendt, MD PhD [facility] Endocr[facility]

## 2023-10-06 ENCOUNTER — Ambulatory Visit: Payer: Self-pay | Admitting: Internal Medicine

## 2023-10-06 MED ORDER — LEVOTHYROXINE SODIUM 100 MCG PO TABS: 100.0000 ug | ORAL_TABLET | Freq: Every day | ORAL | 3 refills | Status: AC

## 2023-10-06 NOTE — Addendum Note (Signed)
 Addended by: TRIXIE FILE on: 10/06/2023 04:33 PM   Modules accepted: Orders

## 2023-11-04 ENCOUNTER — Ambulatory Visit: Payer: Self-pay | Admitting: Internal Medicine

## 2024-10-04 ENCOUNTER — Ambulatory Visit: Payer: Self-pay | Admitting: Internal Medicine
